# Patient Record
Sex: Female | Born: 1975 | Race: Black or African American | Hispanic: No | Marital: Single | Smoking: Current some day smoker
Health system: Southern US, Community
[De-identification: ages and names within clinical notes are randomized; demographics above are authoritative.]

## PROBLEM LIST (undated history)

## (undated) DIAGNOSIS — N921 Excessive and frequent menstruation with irregular cycle: Secondary | ICD-10-CM

## (undated) DIAGNOSIS — I10 Essential (primary) hypertension: Secondary | ICD-10-CM

## (undated) DIAGNOSIS — D251 Intramural leiomyoma of uterus: Secondary | ICD-10-CM

## (undated) DIAGNOSIS — K219 Gastro-esophageal reflux disease without esophagitis: Secondary | ICD-10-CM

## (undated) DIAGNOSIS — D500 Iron deficiency anemia secondary to blood loss (chronic): Secondary | ICD-10-CM

## (undated) HISTORY — PX: CHOLECYSTECTOMY: SHX55

## (undated) HISTORY — PX: WISDOM TOOTH EXTRACTION: SHX21

---

## 2008-02-20 ENCOUNTER — Other Ambulatory Visit: Payer: Self-pay

## 2008-02-20 ENCOUNTER — Emergency Department: Payer: Self-pay | Admitting: Emergency Medicine

## 2008-03-10 ENCOUNTER — Emergency Department: Payer: Self-pay

## 2010-03-01 ENCOUNTER — Emergency Department: Payer: Self-pay | Admitting: Emergency Medicine

## 2011-06-09 ENCOUNTER — Ambulatory Visit: Payer: Self-pay | Admitting: Internal Medicine

## 2011-06-13 ENCOUNTER — Inpatient Hospital Stay: Payer: Self-pay | Admitting: Surgery

## 2011-06-17 LAB — PATHOLOGY REPORT

## 2011-07-09 ENCOUNTER — Ambulatory Visit: Payer: Self-pay | Admitting: Internal Medicine

## 2015-11-18 ENCOUNTER — Encounter: Payer: Self-pay | Admitting: Emergency Medicine

## 2015-11-18 ENCOUNTER — Emergency Department
Admission: EM | Admit: 2015-11-18 | Discharge: 2015-11-19 | Disposition: A | Discharge status: Home / Self Care | Payer: Self-pay | Attending: Emergency Medicine | Admitting: Emergency Medicine

## 2015-11-18 DIAGNOSIS — S61214A Laceration without foreign body of right ring finger without damage to nail, initial encounter: Secondary | ICD-10-CM | POA: Insufficient documentation

## 2015-11-18 DIAGNOSIS — Y288XXA Contact with other sharp object, undetermined intent, initial encounter: Secondary | ICD-10-CM | POA: Insufficient documentation

## 2015-11-18 DIAGNOSIS — S61212A Laceration without foreign body of right middle finger without damage to nail, initial encounter: Secondary | ICD-10-CM | POA: Insufficient documentation

## 2015-11-18 DIAGNOSIS — F17200 Nicotine dependence, unspecified, uncomplicated: Secondary | ICD-10-CM | POA: Insufficient documentation

## 2015-11-18 DIAGNOSIS — Y92019 Unspecified place in single-family (private) house as the place of occurrence of the external cause: Secondary | ICD-10-CM | POA: Insufficient documentation

## 2015-11-18 DIAGNOSIS — IMO0002 Reserved for concepts with insufficient information to code with codable children: Secondary | ICD-10-CM

## 2015-11-18 DIAGNOSIS — Y998 Other external cause status: Secondary | ICD-10-CM | POA: Insufficient documentation

## 2015-11-18 DIAGNOSIS — I10 Essential (primary) hypertension: Secondary | ICD-10-CM | POA: Insufficient documentation

## 2015-11-18 DIAGNOSIS — Y9389 Activity, other specified: Secondary | ICD-10-CM | POA: Insufficient documentation

## 2015-11-18 HISTORY — DX: Essential (primary) hypertension: I10

## 2015-11-18 NOTE — ED Notes (Signed)
Pt arrived from home by EMS post laceration to the right ring finger and top of middle finger. Pt states she hit a 40oz beer bottle and the bottle broke cutting her fingers. Pt states "I cant feel my fingers."

## 2015-11-19 ENCOUNTER — Emergency Department: Payer: Self-pay

## 2015-11-19 MED ORDER — LIDOCAINE HCL (PF) 1 % IJ SOLN
5.0000 mL | Freq: Once | INTRAMUSCULAR | Status: AC
  Administered 2015-11-19: 5 mL

## 2015-11-19 MED ORDER — LIDOCAINE HCL (PF) 1 % IJ SOLN
INTRAMUSCULAR | Status: AC
  Administered 2015-11-19: 5 mL
  Filled 2015-11-19: qty 5

## 2015-11-19 MED ORDER — LIDOCAINE HCL (PF) 1 % IJ SOLN
INTRAMUSCULAR | Status: AC
  Filled 2015-11-19: qty 10

## 2015-11-19 NOTE — Discharge Instructions (Signed)
Please keep the bandages intact for the next 48 hours. Afterward she can remove, keep cuts covered with bacitracin or Neosporin and bandages. Please return to your primary care doctor in 7 days for suture removal. Please return to the emergency department for any signs of infection such as increased redness, increased pain, swelling, pus or fever. As we discussed he may experience numbness in the finger if the nerve was injured, if you continue to experience numbness in the finger please follow-up with orthopedics by calling the number provided.   Laceration Care, Adult    A laceration is a cut that goes through all of the layers of the skin and into the tissue that is right under the skin. Some lacerations heal on their own. Others need to be closed with stitches (sutures), staples, skin adhesive strips, or skin glue. Proper laceration care minimizes the risk of infection and helps the laceration to heal better.  HOW TO CARE FOR YOUR LACERATION  If sutures or staples were used:  Keep the wound clean and dry.  If you were given a bandage (dressing), you should change it at least one time per day or as told by your health care provider. You should also change it if it becomes wet or dirty.  Keep the wound completely dry for the first 24 hours or as told by your health care provider. After that time, you may shower or bathe. However, make sure that the wound is not soaked in water until after the sutures or staples have been removed.  Clean the wound one time each day or as told by your health care provider:  Wash the wound with soap and water.  Rinse the wound with water to remove all soap.  Pat the wound dry with a clean towel. Do not rub the wound. After cleaning the wound, apply a thin layer of antibiotic ointment as told by your health care provider. This will help to prevent infection and keep the dressing from sticking to the wound.  Have the sutures or staples removed as told by your health  care provider. If skin adhesive strips were used:  Keep the wound clean and dry.  If you were given a bandage (dressing), you should change it at least one time per day or as told by your health care provider. You should also change it if it becomes dirty or wet.  Do not get the skin adhesive strips wet. You may shower or bathe, but be careful to keep the wound dry.  If the wound gets wet, pat it dry with a clean towel. Do not rub the wound.  Skin adhesive strips fall off on their own. You may trim the strips as the wound heals. Do not remove skin adhesive strips that are still stuck to the wound. They will fall off in time. If skin glue was used:  Try to keep the wound dry, but you may briefly wet it in the shower or bath. Do not soak the wound in water, such as by swimming.  After you have showered or bathed, gently pat the wound dry with a clean towel. Do not rub the wound.  Do not do any activities that will make you sweat heavily until the skin glue has fallen off on its own.  Do not apply liquid, cream, or ointment medicine to the wound while the skin glue is in place. Using those may loosen the film before the wound has healed.  If you were given a  bandage (dressing), you should change it at least one time per day or as told by your health care provider. You should also change it if it becomes dirty or wet.  If a dressing is placed over the wound, be careful not to apply tape directly over the skin glue. Doing that may cause the glue to be pulled off before the wound has healed.  Do not pick at the glue. The skin glue usually remains in place for 5-10 days, then it falls off of the skin. General Instructions  Take over-the-counter and prescription medicines only as told by your health care provider.  If you were prescribed an antibiotic medicine or ointment, take or apply it as told by your doctor. Do not stop using it even if your condition improves.  To help prevent scarring, make sure to  cover your wound with sunscreen whenever you are outside after stitches are removed, after adhesive strips are removed, or when glue remains in place and the wound is healed. Make sure to wear a sunscreen of at least 30 SPF.  Do not scratch or pick at the wound.  Keep all follow-up visits as told by your health care provider. This is important.  Check your wound every day for signs of infection. Watch for:  Redness, swelling, or pain.  Fluid, blood, or pus. Raise (elevate) the injured area above the level of your heart while you are sitting or lying down, if possible. SEEK MEDICAL CARE IF:  You received a tetanus shot and you have swelling, severe pain, redness, or bleeding at the injection site.  You have a fever.  A wound that was closed breaks open.  You notice a bad smell coming from your wound or your dressing.  You notice something coming out of the wound, such as wood or glass.  Your pain is not controlled with medicine.  You have increased redness, swelling, or pain at the site of your wound.  You have fluid, blood, or pus coming from your wound.  You notice a change in the color of your skin near your wound.  You need to change the dressing frequently due to fluid, blood, or pus draining from the wound.  You develop a new rash.  You develop numbness around the wound. SEEK IMMEDIATE MEDICAL CARE IF:  You develop severe swelling around the wound.  Your pain suddenly increases and is severe.  You develop painful lumps near the wound or on skin that is anywhere on your body.  You have a red streak going away from your wound.  The wound is on your hand or foot and you cannot properly move a finger or toe.  The wound is on your hand or foot and you notice that your fingers or toes look pale or bluish. This information is not intended to replace advice given to you by your health care provider. Make sure you discuss any questions you have with your health care provider.  Document  Released: 07/25/2005 Document Revised: 12/09/2014 Document Reviewed: 07/21/2014  Elsevier Interactive Patient Education [employer].

## 2015-11-19 NOTE — ED Provider Notes (Signed)
[facility] Emergency Department Provider Note  Time seen: 12:14 AM  I have reviewed the triage vital signs and the nursing notes.   HISTORY  Chief Complaint Laceration    HPI Brandi Nichols is a 40 y.o. female with a past medical history of hypertension presents to the emergency department with a laceration to her right hand. According to the patient she attempted to pick up a broken beer bottle which cut her right third and fourth digits. States blood was spurting out of her middle fingers as he came to the emergency department. Denies any other injuries denies LOC. Admits alcohol use tonight.     Past Medical History  Diagnosis Date  . Hypertension     There are no active problems to display for this patient.   Past Surgical History  Procedure Laterality Date  . Cholecystectomy      No current outpatient prescriptions on file.  Allergies Review of patient's allergies indicates no known allergies.  History reviewed. No pertinent family history.  Social History Social History  Substance Use Topics  . Smoking status: Current Some Day Smoker  . Smokeless tobacco: None  . Alcohol Use: Yes    Review of Systems Constitutional: Negative for fever. Cardiovascular: Negative for chest pain. Respiratory: Negative for shortness of breath. Gastrointestinal: Negative for abdominal pain Musculoskeletal: Negative for back pain. Skin: Laceration to right third and fourth digits Neurological: Negative for headache 10-point ROS otherwise negative.  ____________________________________________   PHYSICAL EXAM:  VITAL SIGNS: ED Triage Vitals  Enc Vitals Group     BP 11/18/15 2350 159/85 mmHg     Pulse Rate 11/18/15 2350 105     Resp --      Temp 11/18/15 2350 98.4 F (36.9 C)     Temp Source 11/18/15 2350 Oral     SpO2 11/18/15 2350 99 %     Weight 11/18/15 2343 193 lb (87.544 kg)     Height 11/18/15 2343 5\' 5"  (1.651 m)     Head Cir --       Peak Flow --      Pain Score 11/18/15 2343 10     Pain Loc --      Pain Edu? --      Excl. in [place]? --     Constitutional: Alert and oriented. Well appearing and in no distress. Eyes: Normal exam ENT   Head: Normocephalic and atraumatic.   Mouth/Throat: Mucous membranes are moist. Cardiovascular: Normal rate, regular rhythm. No murmur Respiratory: Normal respiratory effort without tachypnea nor retractions. Breath sounds are clear Gastrointestinal: Soft and nontender. No distention.   Musculoskeletal: Approximate 1.5 cm laceration to her fourth right digit finger pad, hemostatic. Proximate 1.5 cm laceration to the medial aspect of her right third digit with arterial bleed. Neurologic:  Normal speech and language. Patient states subjective numbness to the distal aspect of the right third digit. Skin:  Skin is warm, dry and intact.  Psychiatric: Mood and affect are normal. Speech and behavior are normal.  ____________________________________________     RADIOLOGY  No radiopaque foreign body.  ____________________________________________    INITIAL IMPRESSION / ASSESSMENT AND PLAN / ED COURSE  Pertinent labs & imaging results that were available during my care of the patient were reviewed by me and considered in my medical decision making (see chart for details).  Patient presents with laceration to right third and fourth digits. Currently with a small arterial bleed to the right third digit. We will apply  pressure, I will perform digital blocks and we'll obtain an x-ray. Patient's last tetanus shot was within the past 3 years per patient. Patient will require laceration repair to both digits.  X-ray negative for foreign body. Digital block performed. Patient does not wish to have her fourth digit repaired, laceration is small. We will repair the patient's third digit.  LACERATION REPAIR Performed by: Harvest Dark Authorized by: Harvest Dark Consent:  Verbal consent obtained. Risks and benefits: risks, benefits and alternatives were discussed Consent given by: patient Patient identity confirmed: provided demographic data Prepped and Draped in normal sterile fashion Wound explored  Laceration Location: Right third finger  Laceration Length: 1.5 cm  No Foreign Bodies seen or palpated  Anesthesia: Digital block   Local anesthetic: lidocaine 1 % without epinephrine  Anesthetic total: 4 ml  Irrigation method: syringe Amount of cleaning: standard  Skin closure: Suture, 5-0 proline   Number of sutures: 3   Technique: Simple interrupted   Patient tolerance: Patient tolerated the procedure well with no immediate complications.   ____________________________________________   FINAL CLINICAL IMPRESSION(S) / ED DIAGNOSES  Right hand laceration   Harvest Dark, MD 11/19/15 [phone]

## 2015-11-19 NOTE — ED Notes (Signed)
Pt discharged to home.  Discharge instructions reviewed.  Verbalized understanding.  No questions or concerns at this time.  Teach back verified.  Pt in NAD.  No items left in ED.   

## 2016-01-02 ENCOUNTER — Encounter: Payer: Self-pay | Admitting: Emergency Medicine

## 2016-01-02 ENCOUNTER — Emergency Department: Payer: Self-pay

## 2016-01-02 ENCOUNTER — Emergency Department
Admission: EM | Admit: 2016-01-02 | Discharge: 2016-01-02 | Disposition: A | Discharge status: Home / Self Care | Payer: Self-pay | Attending: Emergency Medicine | Admitting: Emergency Medicine

## 2016-01-02 DIAGNOSIS — M542 Cervicalgia: Secondary | ICD-10-CM | POA: Insufficient documentation

## 2016-01-02 DIAGNOSIS — M791 Myalgia: Secondary | ICD-10-CM | POA: Insufficient documentation

## 2016-01-02 DIAGNOSIS — M7918 Myalgia, other site: Secondary | ICD-10-CM

## 2016-01-02 DIAGNOSIS — I10 Essential (primary) hypertension: Secondary | ICD-10-CM | POA: Insufficient documentation

## 2016-01-02 DIAGNOSIS — F17210 Nicotine dependence, cigarettes, uncomplicated: Secondary | ICD-10-CM | POA: Insufficient documentation

## 2016-01-02 MED ORDER — KETOROLAC TROMETHAMINE 60 MG/2ML IM SOLN
60.0000 mg | Freq: Once | INTRAMUSCULAR | Status: AC
  Administered 2016-01-02: 60 mg via INTRAMUSCULAR
  Filled 2016-01-02: qty 2

## 2016-01-02 MED ORDER — LIDOCAINE 5 % EX PTCH: 1.0000 | MEDICATED_PATCH | Freq: Two times a day (BID) | CUTANEOUS | Status: AC

## 2016-01-02 MED ORDER — LIDOCAINE 5 % EX PTCH
1.0000 | MEDICATED_PATCH | CUTANEOUS | Status: DC
  Administered 2016-01-02: 1 via TRANSDERMAL
  Filled 2016-01-02 (×2): qty 1

## 2016-01-02 MED ORDER — DIAZEPAM 2 MG PO TABS
2.0000 mg | ORAL_TABLET | Freq: Once | ORAL | Status: AC
  Administered 2016-01-02: 2 mg via ORAL
  Filled 2016-01-02: qty 1

## 2016-01-02 MED ORDER — ETODOLAC 200 MG PO CAPS: 200.0000 mg | ORAL_CAPSULE | Freq: Three times a day (TID) | ORAL | Status: DC

## 2016-01-02 NOTE — ED Notes (Signed)
Pt alert and oriented X4, active, cooperative, pt in NAD. RR even and unlabored, color WNL.  Pt informed to return if any life threatening symptoms occur.   

## 2016-01-02 NOTE — Discharge Instructions (Signed)
Musculoskeletal Pain Musculoskeletal pain is muscle and boney aches and pains. These pains can occur in any part of the body. Your caregiver may treat you without knowing the cause of the pain. They may treat you if blood or urine tests, X-rays, and other tests were normal.  CAUSES There is often not a definite cause or reason for these pains. These pains may be caused by a type of germ (virus). The discomfort may also come from overuse. Overuse includes working out too hard when your body is not fit. Boney aches also come from weather changes. Bone is sensitive to atmospheric pressure changes. HOME CARE INSTRUCTIONS   Ask when your test results will be ready. Make sure you get your test results.  Only take over-the-counter or prescription medicines for pain, discomfort, or fever as directed by your caregiver. If you were given medications for your condition, do not drive, operate machinery or power tools, or sign legal documents for 24 hours. Do not drink alcohol. Do not take sleeping pills or other medications that may interfere with treatment.  Continue all activities unless the activities cause more pain. When the pain lessens, slowly resume normal activities. Gradually increase the intensity and duration of the activities or exercise.  During periods of severe pain, bed rest may be helpful. Lay or sit in any position that is comfortable.  Putting ice on the injured area.  Put ice in a bag.  Place a towel between your skin and the bag.  Leave the ice on for 15 to 20 minutes, 3 to 4 times a day.  Follow up with your caregiver for continued problems and no reason can be found for the pain. If the pain becomes worse or does not go away, it may be necessary to repeat tests or do additional testing. Your caregiver may need to look further for a possible cause. SEEK IMMEDIATE MEDICAL CARE IF:  You have pain that is getting worse and is not relieved by medications.  You develop chest pain  that is associated with shortness or breath, sweating, feeling sick to your stomach (nauseous), or throw up (vomit).  Your pain becomes localized to the abdomen.  You develop any new symptoms that seem different or that concern you. MAKE SURE YOU:   Understand these instructions.  Will watch your condition.  Will get help right away if you are not doing well or get worse.   This information is not intended to replace advice given to you by your health care provider. Make sure you discuss any questions you have with your health care provider.   Document Released: 07/25/2005 Document Revised: 10/17/2011 Document Reviewed: 03/29/2013 Elsevier Interactive Patient Education 2016 Elsevier [employer]  Cervical Strain and Sprain  Cervical strain and sprain are injuries that commonly occur with "whiplash" injuries. Whiplash occurs when the neck is forcefully whipped backward or forward, such as during a motor vehicle accident or during contact sports. The muscles, ligaments, tendons, discs, and nerves of the neck are susceptible to injury when this occurs. RISK FACTORS Risk of having a whiplash injury increases if:  Osteoarthritis of the spine.  Situations that make head or neck accidents or trauma more likely.  High-risk sports (football, rugby, wrestling, hockey, auto racing, gymnastics, diving, contact karate, or boxing).  Poor strength and flexibility of the neck.  Previous neck injury.  Poor tackling technique.  Improperly fitted or padded equipment. SYMPTOMS   Pain or stiffness in the front or back of neck or both.  Symptoms  may present immediately or up to 24 hours after injury.  Dizziness, headache, nausea, and vomiting.  Muscle spasm with soreness and stiffness in the neck.  Tenderness and swelling at the injury site. PREVENTION  Learn and use proper technique (avoid tackling with the head, spearing, and head-butting; use proper falling techniques to avoid landing on the  head).  Warm up and stretch properly before activity.  Maintain physical fitness:  Strength, flexibility, and endurance.  Cardiovascular fitness.  Wear properly fitted and padded protective equipment, such as padded soft collars, for participation in contact sports. PROGNOSIS  Recovery from cervical strain and sprain injuries is dependent on the extent of the injury. These injuries are usually curable in 1 week to 3 months with appropriate treatment.  RELATED COMPLICATIONS   Temporary numbness and weakness may occur if the nerve roots are damaged, and this may persist until the nerve has completely healed.  Chronic pain due to frequent recurrence of symptoms.  Prolonged healing, especially if activity is resumed too soon (before complete recovery). TREATMENT  Treatment initially involves the use of ice and medication to help reduce pain and inflammation. It is also important to perform strengthening and stretching exercises and modify activities that worsen symptoms so the injury does not get worse. These exercises may be performed at home or with a therapist. For patients who experience severe symptoms, a soft, padded collar may be recommended to be worn around the neck.  Improving your posture may help reduce symptoms. Posture improvement includes pulling your chin and abdomen in while sitting or standing. If you are sitting, sit in a firm chair with your buttocks against the back of the chair. While sleeping, try replacing your pillow with a small towel rolled to 2 inches in diameter, or use a cervical pillow or soft cervical collar. Poor sleeping positions delay healing.  For patients with nerve root damage, which causes numbness or weakness, the use of a cervical traction apparatus may be recommended. Surgery is rarely necessary for these injuries. However, cervical strain and sprains that are present at birth (congenital) may require surgery. MEDICATION   If pain medication is  necessary, nonsteroidal anti-inflammatory medications, such as aspirin and ibuprofen, or other minor pain relievers, such as acetaminophen, are often recommended.  Do not take pain medication for 7 days before surgery.  Prescription pain relievers may be given if deemed necessary by your caregiver. Use only as directed and only as much as you need. HEAT AND COLD:   Cold treatment (icing) relieves pain and reduces inflammation. Cold treatment should be applied for 10 to 15 minutes every 2 to 3 hours for inflammation and pain and immediately after any activity that aggravates your symptoms. Use ice packs or an ice massage.  Heat treatment may be used prior to performing the stretching and strengthening activities prescribed by your caregiver, physical therapist, or athletic trainer. Use a heat pack or a warm soak. SEEK MEDICAL CARE IF:   Symptoms get worse or do not improve in 2 weeks despite treatment.  New, unexplained symptoms develop (drugs used in treatment may produce side effects).

## 2016-01-02 NOTE — ED Provider Notes (Signed)
[facility] Emergency Department Provider Note   ____________________________________________  Time seen: Approximately [id number] AM  I have reviewed the triage vital signs and the nursing notes.   HISTORY  Chief Complaint Numbness and Neck Pain    HPI Brandi Nichols is a 40 y.o. female who comes into the ED with 1 week of neck pain. The patient reports that the pain hurts very bad. She denies any trauma but reports she does a lot of lifting at work. She's not sure if this is due to work stress or if she slept wrong. The patient reports that she has taken Tylenol for the pain but has not helped. She is also been some icy hot patches. The patient feels some numbness to her right shoulder but does not go all the way down to her fingers or hands. She reports that she's never had this happen before. The patient rates her pain a 7 out of 10 in intensity. She was concerned that this was going on for the past week so she decided to come into the hospital for evaluation.   Past Medical History  Diagnosis Date  . Hypertension     There are no active problems to display for this patient.   Past Surgical History  Procedure Laterality Date  . Cholecystectomy      Current Outpatient Rx  Name  Route  Sig  Dispense  Refill  . etodolac (LODINE) 200 MG capsule   Oral   Take 1 capsule (200 mg total) by mouth every 8 (eight) hours.   12 capsule   0   . lidocaine (LIDODERM) 5 %   Transdermal   Place 1 patch onto the skin every 12 (twelve) hours. Remove & Discard patch within 12 hours or as directed by MD   10 patch   0     Allergies Review of patient's allergies indicates no known allergies.  No family history on file.  Social History Social History  Substance Use Topics  . Smoking status: Current Some Day Smoker    Types: Cigarettes  . Smokeless tobacco: Never Used  . Alcohol Use: Yes    Review of Systems Constitutional: No fever/chills Eyes: No  visual changes. ENT: No sore throat. Cardiovascular: Denies chest pain. Respiratory: Denies shortness of breath. Gastrointestinal: No abdominal pain.  No nausea, no vomiting.  No diarrhea.  No constipation. Genitourinary: Negative for dysuria. Musculoskeletal: Neck pain, shoulder pain Skin: Negative for rash. Neurological: Negative for headaches, focal weakness or numbness.  10-point ROS otherwise negative.  ____________________________________________   PHYSICAL EXAM:  VITAL SIGNS: ED Triage Vitals  Enc Vitals Group     BP 01/02/16 0110 149/76 mmHg     Pulse Rate 01/02/16 0110 84     Resp 01/02/16 0110 18     Temp 01/02/16 0110 98.2 F (36.8 C)     Temp Source 01/02/16 0110 Oral     SpO2 01/02/16 0110 100 %     Weight 01/02/16 0110 196 lb (88.905 kg)     Height 01/02/16 0110 5\' 5"  (1.651 m)     Head Cir --      Peak Flow --      Pain Score 01/02/16 0110 8     Pain Loc --      Pain Edu? --      Excl. in [place]? --     Constitutional: Alert and oriented. Well appearing and in Mild distress. Eyes: Conjunctivae are normal. PERRL. EOMI. Head: Atraumatic.  Nose: No congestion/rhinnorhea. Mouth/Throat: Mucous membranes are moist.  Oropharynx non-erythematous. Neck: Mild cervical spine tenderness to palpation. Tenderness to palpation of right trapezius into the deltoid area I'll do pain with range of motion. Cardiovascular: Normal rate, regular rhythm. Grossly normal heart sounds.  Good peripheral circulation. Respiratory: Normal respiratory effort.  No retractions. Lungs CTAB. Gastrointestinal: Soft and nontender. No distention. Positive bowel sounds Musculoskeletal: No lower extremity tenderness nor edema.  Neurologic:  Normal speech and language.  Skin:  Skin is warm, dry and intact.  Psychiatric: Mood and affect are normal.   ____________________________________________   LABS (all labs ordered are listed, but only abnormal results are displayed)  Labs Reviewed - No  data to display ____________________________________________  EKG  None ____________________________________________  RADIOLOGY  Cervical spine x-ray: Degenerative disc disease in the cervical spine, no acute bony abnormality is seen him radiographically. ____________________________________________   PROCEDURES  Procedure(s) performed: None  Critical Care performed: No  ____________________________________________   INITIAL IMPRESSION / ASSESSMENT AND PLAN / ED COURSE  Pertinent labs & imaging results that were available during my care of the patient were reviewed by me and considered in my medical decision making (see chart for details).  This is a 40 year old female who comes in with some right neck pain as well as some numbness to her shoulder and upper arm. I did perform an x-ray with a concern for degenerative disc disease which was discovered on the x-ray. I gave the patient a dose of Valium, Toradol and Lidoderm patch. The patient is feeling improved. She was initially sleeping in the room when I arrived. The patient will be discharged home to follow-up with her primary care physician. ____________________________________________   FINAL CLINICAL IMPRESSION(S) / ED DIAGNOSES  Final diagnoses:  Cervicalgia  Musculoskeletal pain      NEW MEDICATIONS STARTED DURING THIS VISIT:  Discharge Medication List as of 01/02/2016  6:45 AM    START taking these medications   Details  etodolac (LODINE) 200 MG capsule Take 1 capsule (200 mg total) by mouth every 8 (eight) hours., Starting 01/02/2016, Until Discontinued, Print    lidocaine (LIDODERM) 5 % Place 1 patch onto the skin every 12 (twelve) hours. Remove & Discard patch within 12 hours or as directed by MD, Starting 01/02/2016, Until Sun 01/01/17, Print         Note:  This document was prepared using Dragon voice recognition software and may include unintentional dictation errors.    Loney Hering,  MD 01/02/16 [phone]

## 2016-01-02 NOTE — ED Notes (Signed)
Pt presents to ED with neck pain; unsure of any injury. Pt states she woke up a week ago with neck pain that limits her range of motion. Today her pain made her right arm feel numb. Pt states her pain is affecting her sleeping and her work.

## 2016-01-18 ENCOUNTER — Emergency Department
Admission: EM | Admit: 2016-01-18 | Discharge: 2016-01-18 | Disposition: A | Discharge status: Home / Self Care | Payer: Self-pay | Attending: Emergency Medicine | Admitting: Emergency Medicine

## 2016-01-18 ENCOUNTER — Encounter: Payer: Self-pay | Admitting: Emergency Medicine

## 2016-01-18 ENCOUNTER — Emergency Department: Payer: Self-pay

## 2016-01-18 DIAGNOSIS — Z79899 Other long term (current) drug therapy: Secondary | ICD-10-CM | POA: Insufficient documentation

## 2016-01-18 DIAGNOSIS — I10 Essential (primary) hypertension: Secondary | ICD-10-CM | POA: Insufficient documentation

## 2016-01-18 DIAGNOSIS — M25572 Pain in left ankle and joints of left foot: Secondary | ICD-10-CM | POA: Insufficient documentation

## 2016-01-18 DIAGNOSIS — F17210 Nicotine dependence, cigarettes, uncomplicated: Secondary | ICD-10-CM | POA: Insufficient documentation

## 2016-01-18 MED ORDER — TRAMADOL HCL 50 MG PO TABS: 50.0000 mg | ORAL_TABLET | Freq: Four times a day (QID) | ORAL | Status: AC | PRN

## 2016-01-18 MED ORDER — NAPROXEN 500 MG PO TABS: 500.0000 mg | ORAL_TABLET | Freq: Two times a day (BID) | ORAL | Status: AC

## 2016-01-18 NOTE — ED Provider Notes (Signed)
[facility] Emergency Department Provider Note   ____________________________________________  Time seen: Approximately 5:38 PM  I have reviewed the triage vital signs and the nursing notes.   HISTORY  Chief Complaint Ankle Pain    HPI Brandi Nichols is a 40 y.o. female patient complaining of pain that radiates from the top of foot to the medial aspect of her ankle 3 days. Patient state approximate month ago she twisted her foot playing horseshoes. Patient states she had pain to the ankle and foot was seems to get gotten better until 3 days ago. Patient states pain increases with ambulation and prolonged standing. No palliative measures taken for this complaint. Patient rates the pain as a 7/10.   Past Medical History  Diagnosis Date  . Hypertension     There are no active problems to display for this patient.   Past Surgical History  Procedure Laterality Date  . Cholecystectomy      Current Outpatient Rx  Name  Route  Sig  Dispense  Refill  . etodolac (LODINE) 200 MG capsule   Oral   Take 1 capsule (200 mg total) by mouth every 8 (eight) hours.   12 capsule   0   . lidocaine (LIDODERM) 5 %   Transdermal   Place 1 patch onto the skin every 12 (twelve) hours. Remove & Discard patch within 12 hours or as directed by MD   10 patch   0   . naproxen (NAPROSYN) 500 MG tablet   Oral   Take 1 tablet (500 mg total) by mouth 2 (two) times daily with a meal.   30 tablet   0   . traMADol (ULTRAM) 50 MG tablet   Oral   Take 1 tablet (50 mg total) by mouth every 6 (six) hours as needed.   20 tablet   0     Allergies Review of patient's allergies indicates no known allergies.  No family history on file.  Social History Social History  Substance Use Topics  . Smoking status: Current Some Day Smoker    Types: Cigarettes  . Smokeless tobacco: Never Used  . Alcohol Use: 1.8 oz/week    3 Standard drinks or equivalent per week     Review of Systems Constitutional: No fever/chills Eyes: No visual changes. ENT: No sore throat. Cardiovascular: Denies chest pain. Respiratory: Denies shortness of breath. Gastrointestinal: No abdominal pain.  No nausea, no vomiting.  No diarrhea.  No constipation. Genitourinary: Negative for dysuria. Musculoskeletal: Left foot and ankle pain Skin: Negative for rash. Neurological: Negative for headaches, focal weakness or numbness.    ____________________________________________   PHYSICAL EXAM:  VITAL SIGNS: ED Triage Vitals  Enc Vitals Group     BP --      Pulse --      Resp --      Temp --      Temp src --      SpO2 --      Weight --      Height --      Head Cir --      Peak Flow --      Pain Score --      Pain Loc --      Pain Edu? --      Excl. in [place]? --     Constitutional: Alert and oriented. Well appearing and in no acute distress. Eyes: Conjunctivae are normal. PERRL. EOMI. Head: Atraumatic. Nose: No congestion/rhinnorhea. Mouth/Throat: Mucous membranes are  moist.  Oropharynx non-erythematous. Neck: No stridor.  No cervical spine tenderness to palpation. Hematological/Lymphatic/Immunilogical: No cervical lymphadenopathy. Cardiovascular: Normal rate, regular rhythm. Grossly normal heart sounds.  Good peripheral circulation. Respiratory: Normal respiratory effort.  No retractions. Lungs CTAB. Gastrointestinal: Soft and nontender. No distention. No abdominal bruits. No CVA tenderness. Musculoskeletal: No obvious deformity to the left foot and ankle. Patient has some moderate guarding palpation medial malleolus and dorsal aspect of the foot. Neurologic:  Normal speech and language. No gross focal neurologic deficits are appreciated. No gait instability. Skin:  Skin is warm, dry and intact. No rash noted. Psychiatric: Mood and affect are normal. Speech and behavior are normal.  ____________________________________________   LABS (all labs ordered are  listed, but only abnormal results are displayed)  Labs Reviewed - No data to display ____________________________________________  EKG   ____________________________________________  RADIOLOGY  No acute findings on x-ray. Patient has findings suggestive of prior injury to the distal tibia. ____________________________________________   PROCEDURES  Procedure(s) performed: None  Critical Care performed: No  ____________________________________________   INITIAL IMPRESSION / ASSESSMENT AND PLAN / ED COURSE  Pertinent labs & imaging results that were available during my care of the patient were reviewed by me and considered in my medical decision making (see chart for details).  Left foot and ankle pain secondary to previous injury. Discussed x-ray finding with patient. Patient given discharge care instruction. Patient given a prescription for tramadol and naproxen. Patient advised we are ankle support an open shoe for 2 weeks. Patient advised to follow-up with podiatry if no improvement in 2 weeks. ____________________________________________   FINAL CLINICAL IMPRESSION(S) / ED DIAGNOSES  Final diagnoses:  Foot joint pain, left      NEW MEDICATIONS STARTED DURING THIS VISIT:  New Prescriptions   NAPROXEN (NAPROSYN) 500 MG TABLET    Take 1 tablet (500 mg total) by mouth 2 (two) times daily with a meal.   TRAMADOL (ULTRAM) 50 MG TABLET    Take 1 tablet (50 mg total) by mouth every 6 (six) hours as needed.     Note:  This document was prepared using Dragon voice recognition software and may include unintentional dictation errors.    Sable Feil, PA-C 01/18/16 1835  Delman Kitten, MD 01/19/16 0010

## 2016-01-18 NOTE — ED Notes (Signed)
Pt reports that she was playing horseshoes a month or so ago and hurt her ankle. States it has started hurting again, with pain shooting up her leg in the last few days. NAD noted.

## 2016-01-18 NOTE — Discharge Instructions (Signed)
Were open shoe for 2 weeks. Joint Pain Joint pain, which is also called arthralgia, can be caused by many things. Joint pain often goes away when you follow your health care provider's instructions for relieving pain at home. However, joint pain can also be caused by conditions that require further treatment. Common causes of joint pain include:  Bruising in the area of the joint.  Overuse of the joint.  Wear and tear on the joints that occur with aging (osteoarthritis).  Various other forms of arthritis.  A buildup of a crystal form of uric acid in the joint (gout).  Infections of the joint (septic arthritis) or of the bone (osteomyelitis). Your health care provider may recommend medicine to help with the pain. If your joint pain continues, additional tests may be needed to diagnose your condition. HOME CARE INSTRUCTIONS Watch your condition for any changes. Follow these instructions as directed to lessen the pain that you are feeling.  Take medicines only as directed by your health care provider.  Rest the affected area for as long as your health care provider says that you should. If directed to do so, raise the painful joint above the level of your heart while you are sitting or lying down.  Do not do things that cause or worsen pain.  If directed, apply ice to the painful area:  Put ice in a plastic bag.  Place a towel between your skin and the bag.  Leave the ice on for 20 minutes, 2-3 times per day.  Wear an elastic bandage, splint, or sling as directed by your health care provider. Loosen the elastic bandage or splint if your fingers or toes become numb and tingle, or if they turn cold and blue.  Begin exercising or stretching the affected area as directed by your health care provider. Ask your health care provider what types of exercise are safe for you.  Keep all follow-up visits as directed by your health care provider. This is important. SEEK MEDICAL CARE IF:  Your  pain increases, and medicine does not help.  Your joint pain does not improve within 3 days.  You have increased bruising or swelling.  You have a fever.  You lose 10 lb (4.5 kg) or more without trying. SEEK IMMEDIATE MEDICAL CARE IF:  You are not able to move the joint.  Your fingers or toes become numb or they turn cold and blue.   This information is not intended to replace advice given to you by your health care provider. Make sure you discuss any questions you have with your health care provider.   Document Released: 07/25/2005 Document Revised: 08/15/2014 Document Reviewed: 05/06/2014 Elsevier Interactive Patient Education [employer].

## 2018-02-03 ENCOUNTER — Other Ambulatory Visit: Payer: Self-pay

## 2018-02-03 ENCOUNTER — Emergency Department
Admission: EM | Admit: 2018-02-03 | Discharge: 2018-02-03 | Disposition: A | Discharge status: Home / Self Care | Payer: Self-pay | Attending: Emergency Medicine | Admitting: Emergency Medicine

## 2018-02-03 DIAGNOSIS — H53149 Visual discomfort, unspecified: Secondary | ICD-10-CM | POA: Insufficient documentation

## 2018-02-03 DIAGNOSIS — M25512 Pain in left shoulder: Secondary | ICD-10-CM | POA: Insufficient documentation

## 2018-02-03 DIAGNOSIS — F17210 Nicotine dependence, cigarettes, uncomplicated: Secondary | ICD-10-CM | POA: Insufficient documentation

## 2018-02-03 DIAGNOSIS — I10 Essential (primary) hypertension: Secondary | ICD-10-CM | POA: Insufficient documentation

## 2018-02-03 DIAGNOSIS — H538 Other visual disturbances: Secondary | ICD-10-CM | POA: Insufficient documentation

## 2018-02-03 DIAGNOSIS — H15102 Unspecified episcleritis, left eye: Secondary | ICD-10-CM | POA: Insufficient documentation

## 2018-02-03 MED ORDER — ERYTHROMYCIN 5 MG/GM OP OINT
TOPICAL_OINTMENT | Freq: Once | OPHTHALMIC | Status: AC
  Administered 2018-02-03: 1 via OPHTHALMIC
  Filled 2018-02-03: qty 1

## 2018-02-03 MED ORDER — FLUORESCEIN SODIUM 1 MG OP STRP
ORAL_STRIP | OPHTHALMIC | Status: AC
  Administered 2018-02-03: 1 via OPHTHALMIC
  Filled 2018-02-03: qty 1

## 2018-02-03 MED ORDER — TRAMADOL HCL 50 MG PO TABS: 50.0000 mg | ORAL_TABLET | Freq: Four times a day (QID) | ORAL | 0 refills | Status: DC | PRN

## 2018-02-03 MED ORDER — TETRACAINE HCL 0.5 % OP SOLN
OPHTHALMIC | Status: AC
  Administered 2018-02-03: 1 [drp] via OPHTHALMIC
  Filled 2018-02-03: qty 4

## 2018-02-03 MED ORDER — FLUORESCEIN SODIUM 1 MG OP STRP
1.0000 | ORAL_STRIP | Freq: Once | OPHTHALMIC | Status: AC
  Administered 2018-02-03: 1 via OPHTHALMIC

## 2018-02-03 MED ORDER — ERYTHROMYCIN 5 MG/GM OP OINT: 1.0000 "application " | TOPICAL_OINTMENT | Freq: Three times a day (TID) | OPHTHALMIC | 0 refills | Status: AC

## 2018-02-03 MED ORDER — KETOROLAC TROMETHAMINE 60 MG/2ML IM SOLN
60.0000 mg | Freq: Once | INTRAMUSCULAR | Status: AC
  Administered 2018-02-03: 60 mg via INTRAMUSCULAR
  Filled 2018-02-03: qty 2

## 2018-02-03 MED ORDER — TETRACAINE HCL 0.5 % OP SOLN
1.0000 [drp] | Freq: Once | OPHTHALMIC | Status: AC
  Administered 2018-02-03: 1 [drp] via OPHTHALMIC

## 2018-02-03 NOTE — ED Provider Notes (Signed)
[facility] Emergency Department Provider Note   ____________________________________________   First MD Initiated Contact with Patient 02/03/18 [phone]     (approximate)  I have reviewed the triage vital signs and the nursing notes.   HISTORY  Chief Complaint Eye Pain    HPI Brandi Nichols is a 42 y.o. female who comes into the hospital today with some redness to her left eye as well as some pain to her left shoulder.  The patient states that she woke up with redness in the corner of her eye.  Her eye was hurting and her shoulder was bothering her.  She feels like something is poking her in the eye.  She denies any discharge but does have some blurred vision.  She reports that her more when the bright light hits it.  She is never had this before.  The patient reports that it hurts to move her eye as well.  She denies any chest pain.  The pain in her arm it goes from her shoulder to her elbow.  She denies any trauma or heavy lifting.  She rates her pain a 7 out of 10 in intensity.  The patient did not take any medicine for the pain.  She is here today for evaluation.   Past Medical History:  Diagnosis Date  . Hypertension     There are no active problems to display for this patient.   Past Surgical History:  Procedure Laterality Date  . CHOLECYSTECTOMY      Prior to Admission medications   Medication Sig Start Date End Date Taking? Authorizing Provider  erythromycin ophthalmic ointment Place 1 application into the left eye 3 (three) times daily for 5 days. 02/03/18 02/08/18  Loney Hering, MD  etodolac (LODINE) 200 MG capsule Take 1 capsule (200 mg total) by mouth every 8 (eight) hours. 01/02/16   Loney Hering, MD  traMADol (ULTRAM) 50 MG tablet Take 1 tablet (50 mg total) by mouth every 6 (six) hours as needed. 02/03/18   Loney Hering, MD    Allergies Patient has no known allergies.  No family history on file.  Social History Social  History   Tobacco Use  . Smoking status: Current Some Day Smoker    Types: Cigarettes  . Smokeless tobacco: Never Used  Substance Use Topics  . Alcohol use: Yes    Alcohol/week: 1.8 oz    Types: 3 Standard drinks or equivalent per week  . Drug use: No    Review of Systems  Constitutional: No fever/chills Eyes: No visual changes. ENT: No sore throat. Cardiovascular: Denies chest pain. Respiratory: Denies shortness of breath. Gastrointestinal: No abdominal pain.  No nausea, no vomiting.  No diarrhea.  No constipation. Genitourinary: Negative for dysuria. Musculoskeletal: Negative for back pain. Skin: Negative for rash. Neurological: Negative for headaches, focal weakness or numbness. [place] is shoulder is so  ____________________________________________   PHYSICAL EXAM:  VITAL SIGNS: ED Triage Vitals  Enc Vitals Group     BP 02/03/18 0520 (!) 157/91     Pulse Rate 02/03/18 0520 82     Resp 02/03/18 0520 16     Temp 02/03/18 0520 98.8 F (37.1 C)     Temp Source 02/03/18 0520 Oral     SpO2 02/03/18 0520 96 %     Weight 02/03/18 0513 182 lb (82.6 kg)     Height 02/03/18 0513 5\' 6"  (1.676 m)     Head Circumference --  Peak Flow --      Pain Score 02/03/18 0513 7     Pain Loc --      Pain Edu? --      Excl. in [place]? --     Constitutional: Alert and oriented. Well appearing and in mild distress. Eyes: Conjunctivae are normal. PERRL. EOMI. Small area of erythema to left lateral eye, no uptake on fluorescein exam, left intraocular pressures 18 and 14 respectively Head: Atraumatic. Nose: No congestion/rhinnorhea. Mouth/Throat: Mucous membranes are moist.  Oropharynx non-erythematous. Cardiovascular: Normal rate, regular rhythm. Grossly normal heart sounds.  Good peripheral circulation. Respiratory: Normal respiratory effort.  No retractions. Lungs CTAB. Gastrointestinal: Soft and nontender. No distention. Positive bowel sounds Musculoskeletal: Tenderness to  palpation of the deltoid and posterior shoulder, pain with passive range of motion above the level of the shoulder. Neurologic:  Normal speech and language.  Skin:  Skin is warm, dry and intact. Psychiatric: Mood and affect are normal.  ____________________________________________   LABS (all labs ordered are listed, but only abnormal results are displayed)  Labs Reviewed - No data to display ____________________________________________  EKG  none ____________________________________________  RADIOLOGY  ED MD interpretation:  none  Official radiology report(s): No results found.  ____________________________________________   PROCEDURES  Procedure(s) performed: None  Procedures  Critical Care performed: No  ____________________________________________   INITIAL IMPRESSION / ASSESSMENT AND PLAN / ED COURSE  As part of my medical decision making, I reviewed the following data within the electronic MEDICAL RECORD NUMBER Notes from prior ED visits and [place] Controlled Substance Database   This is a 42 year old female who comes into the hospital today with some eye pain and left shoulder pain.  I did examine the patient's eye and her visual acuity was evaluated.  It appears that the patient has some episcleritis.  She does not have any pain with range of motion and she has no uptake on fluorescein exam.  I will give the patient some erythromycin ointment and she will be discharged home.  The patient's shoulder pain is reminiscent of rotator cuff injury.  I will give the patient a shot of Toradol and have her follow-up with orthopedic surgery.  The patient be discharged home to follow-up.      ____________________________________________   FINAL CLINICAL IMPRESSION(S) / ED DIAGNOSES  Final diagnoses:  Episcleritis of left eye  Acute pain of left shoulder     ED Discharge Orders        Ordered    erythromycin ophthalmic ointment  3 times daily     02/03/18 0706     traMADol (ULTRAM) 50 MG tablet  Every 6 hours PRN     02/03/18 0706       Note:  This document was prepared using Dragon voice recognition software and may include unintentional dictation errors.    Loney Hering, MD 02/03/18 [id number]

## 2018-02-03 NOTE — Discharge Instructions (Addendum)
Please follow up with ophthalmology and orthopedic surgery for further evaluation of your symptoms.

## 2018-02-03 NOTE — ED Triage Notes (Signed)
Pt in with co left eye pain that radiates to left face and shoulder.

## 2018-02-03 NOTE — ED Notes (Signed)
Registration at bedside.

## 2018-02-03 NOTE — ED Notes (Signed)
ED Provider at bedside. 

## 2019-07-09 ENCOUNTER — Other Ambulatory Visit: Payer: Self-pay

## 2019-07-09 ENCOUNTER — Emergency Department
Admission: EM | Admit: 2019-07-09 | Discharge: 2019-07-09 | Disposition: A | Discharge status: Home / Self Care | Payer: Self-pay | Attending: Emergency Medicine | Admitting: Emergency Medicine

## 2019-07-09 ENCOUNTER — Emergency Department: Payer: Self-pay

## 2019-07-09 ENCOUNTER — Encounter: Payer: Self-pay | Admitting: Emergency Medicine

## 2019-07-09 DIAGNOSIS — M25511 Pain in right shoulder: Secondary | ICD-10-CM | POA: Insufficient documentation

## 2019-07-09 DIAGNOSIS — I10 Essential (primary) hypertension: Secondary | ICD-10-CM | POA: Insufficient documentation

## 2019-07-09 DIAGNOSIS — M545 Low back pain, unspecified: Secondary | ICD-10-CM

## 2019-07-09 DIAGNOSIS — G8929 Other chronic pain: Secondary | ICD-10-CM | POA: Insufficient documentation

## 2019-07-09 DIAGNOSIS — F17210 Nicotine dependence, cigarettes, uncomplicated: Secondary | ICD-10-CM | POA: Insufficient documentation

## 2019-07-09 MED ORDER — TRAMADOL HCL 50 MG PO TABS: 50.0000 mg | ORAL_TABLET | Freq: Four times a day (QID) | ORAL | 0 refills | Status: DC | PRN

## 2019-07-09 MED ORDER — PREDNISONE 10 MG PO TABS: ORAL_TABLET | ORAL | 0 refills | Status: DC
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
## 2019-07-09 NOTE — ED Triage Notes (Signed)
C/O burning pain to right back and right shoulder pain x 2-3 days.  States standing makes pain worse.  Denies urinary symptoms or injury.

## 2019-07-09 NOTE — Discharge Instructions (Signed)
Follow-up with your primary care provider if any continued problems.  Call and make an appointment.  Begin taking prednisone as directed beginning with 6 tablets today and decreasing by 1 tablet daily for the next 6 days.  Tramadol was also sent to the pharmacy as needed for pain.  Do not drive or operate machinery while taking this medication.  You may use ice or heat to your back and shoulder as needed for discomfort.
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
## 2019-09-04 ENCOUNTER — Emergency Department
Admission: EM | Admit: 2019-09-04 | Discharge: 2019-09-04 | Disposition: A | Discharge status: Home / Self Care | Payer: Self-pay | Attending: Emergency Medicine | Admitting: Emergency Medicine

## 2019-09-04 ENCOUNTER — Other Ambulatory Visit: Payer: Self-pay

## 2019-09-04 ENCOUNTER — Encounter: Payer: Self-pay | Admitting: Emergency Medicine

## 2019-09-04 DIAGNOSIS — K115 Sialolithiasis: Secondary | ICD-10-CM | POA: Insufficient documentation

## 2019-09-04 DIAGNOSIS — F17210 Nicotine dependence, cigarettes, uncomplicated: Secondary | ICD-10-CM | POA: Insufficient documentation

## 2019-09-04 DIAGNOSIS — I10 Essential (primary) hypertension: Secondary | ICD-10-CM | POA: Insufficient documentation

## 2019-09-04 LAB — CBC WITH DIFFERENTIAL/PLATELET
Abs Immature Granulocytes: 0.01 10*3/uL (ref 0.00–0.07)
Basophils Absolute: 0 10*3/uL (ref 0.0–0.1)
Basophils Relative: 1 %
Eosinophils Absolute: 0.2 10*3/uL (ref 0.0–0.5)
Eosinophils Relative: 5 %
HCT: 33.7 % — ABNORMAL LOW (ref 36.0–46.0)
Hemoglobin: 9.2 g/dL — ABNORMAL LOW (ref 12.0–15.0)
Immature Granulocytes: 0 %
Lymphocytes Relative: 32 %
Lymphs Abs: 1.5 10*3/uL (ref 0.7–4.0)
MCH: 19.2 pg — ABNORMAL LOW (ref 26.0–34.0)
MCHC: 27.3 g/dL — ABNORMAL LOW (ref 30.0–36.0)
MCV: 70.4 fL — ABNORMAL LOW (ref 80.0–100.0)
Monocytes Absolute: 0.7 10*3/uL (ref 0.1–1.0)
Monocytes Relative: 14 %
Neutro Abs: 2.4 10*3/uL (ref 1.7–7.7)
Neutrophils Relative %: 48 %
Platelets: 486 10*3/uL — ABNORMAL HIGH (ref 150–400)
RBC: 4.79 MIL/uL (ref 3.87–5.11)
RDW: 20.3 % — ABNORMAL HIGH (ref 11.5–15.5)
WBC: 4.9 10*3/uL (ref 4.0–10.5)
nRBC: 0 % (ref 0.0–0.2)

## 2019-09-04 LAB — BASIC METABOLIC PANEL
Anion gap: 8 (ref 5–15)
BUN: 9 mg/dL (ref 6–20)
CO2: 28 mmol/L (ref 22–32)
Calcium: 9.8 mg/dL (ref 8.9–10.3)
Chloride: 103 mmol/L (ref 98–111)
Creatinine, Ser: 0.65 mg/dL (ref 0.44–1.00)
GFR calc Af Amer: >60 mL/min (ref >60)
GFR calc non Af Amer: >60 mL/min (ref >60)
Glucose, Bld: 114 mg/dL — ABNORMAL HIGH (ref 70–99)
Potassium: 3.9 mmol/L (ref 3.5–5.1)
Sodium: 139 mmol/L (ref 135–145)

## 2019-09-04 MED ORDER — CEPHALEXIN 500 MG PO CAPS: 500.0000 mg | ORAL_CAPSULE | Freq: Three times a day (TID) | ORAL | 0 refills | Status: DC

## 2019-09-04 MED ORDER — HYDROCODONE-ACETAMINOPHEN 5-325 MG PO TABS: 1.0000 | ORAL_TABLET | Freq: Four times a day (QID) | ORAL | 0 refills | Status: DC | PRN

## 2019-09-04 NOTE — ED Provider Notes (Signed)
[facility] Emergency Department Provider Note  ____________________________________________   First MD Initiated Contact with Patient 09/04/19 1302     (approximate)  I have reviewed the triage vital signs and the nursing notes.   HISTORY  Chief Complaint Lymphadenopathy   HPI Brandi Nichols is a 44 y.o. female presents to the ED with complaint of left lymph node swollen and tenderness with a lymph node at her left ear.  Patient denies any difficulty swallowing or speaking.  Patient denies any fever or chills.  She states that when she is eating the area under her chin enlarges and then goes down.  She rates her pain as 10/10.     Past Medical History:  Diagnosis Date  . Hypertension     There are no problems to display for this patient.   Past Surgical History:  Procedure Laterality Date  . CHOLECYSTECTOMY      Prior to Admission medications   Medication Sig Start Date End Date Taking? Authorizing Provider  cephALEXin (KEFLEX) 500 MG capsule Take 1 capsule (500 mg total) by mouth 3 (three) times daily. 09/04/19   Johnn Hai, PA-C  HYDROcodone-acetaminophen (NORCO/VICODIN) 5-325 MG tablet Take 1 tablet by mouth every 6 (six) hours as needed for moderate pain. 09/04/19   Johnn Hai, PA-C    Allergies Patient has no known allergies.  History reviewed. No pertinent family history.  Social History Social History   Tobacco Use  . Smoking status: Current Some Day Smoker    Types: Cigarettes  . Smokeless tobacco: Never Used  Substance Use Topics  . Alcohol use: Yes    Alcohol/week: 3.0 standard drinks    Types: 3 Standard drinks or equivalent per week  . Drug use: No    Review of Systems Constitutional: No fever/chills Eyes: No visual changes. ENT: No sore throat.  Questionable lymph node enlargement. Cardiovascular: Denies chest pain. Respiratory: Denies shortness of breath. Gastrointestinal:  No nausea, no vomiting.      Genitourinary: History of anemia secondary to heavy menses. Musculoskeletal: Negative for muscle skeletal pain. Skin: Negative for rash. Neurological: Negative for headaches, focal weakness or numbness. ____________________________________________   PHYSICAL EXAM:  VITAL SIGNS: ED Triage Vitals  Enc Vitals Group     BP 09/04/19 1240 (!) 160/82     Pulse Rate 09/04/19 1240 78     Resp 09/04/19 1240 16     Temp 09/04/19 1240 98.5 F (36.9 C)     Temp Source 09/04/19 1240 Oral     SpO2 09/04/19 1240 100 %     Weight 09/04/19 1238 186 lb (84.4 kg)     Height 09/04/19 1238 5\' 5"  (1.651 m)     Head Circumference --      Peak Flow --      Pain Score 09/04/19 1238 10     Pain Loc --      Pain Edu? --      Excl. in [place]? --     Constitutional: Alert and oriented. Well appearing and in no acute distress. Eyes: Conjunctivae are normal.  Head: Atraumatic. Nose: No congestion/rhinnorhea. Mouth/Throat: Mucous membranes are moist.  Oropharynx non-erythematous.  No oral edema.  Uvula is midline. Neck: No stridor.   Hematological/Lymphatic/Immunilogical: Small, tender, mobile postauricular lymph node present on the left side.  Submandibular area there is soft tenderness without erythema or warmth. Cardiovascular: Normal rate, regular rhythm. Grossly normal heart sounds.  Good peripheral circulation. Respiratory: Normal respiratory effort.  No  retractions. Lungs CTAB. Musculoskeletal: Moves upper and lower extremities with any difficulty. Neurologic:  Normal speech and language. No gross focal neurologic deficits are appreciated.  Skin:  Skin is warm, dry and intact. No rash noted. Psychiatric: Mood and affect are normal. Speech and behavior are normal.  ____________________________________________   LABS (all labs ordered are listed, but only abnormal results are displayed)  Labs Reviewed  CBC WITH DIFFERENTIAL/PLATELET - Abnormal; Notable for the following components:      Result  Value   Hemoglobin 9.2 (*)    HCT 33.7 (*)    MCV 70.4 (*)    MCH 19.2 (*)    MCHC 27.3 (*)    RDW 20.3 (*)    Platelets 486 (*)    All other components within normal limits  BASIC METABOLIC PANEL - Abnormal; Notable for the following components:   Glucose, Bld 114 (*)    All other components within normal limits     PROCEDURES  Procedure(s) performed (including Critical Care):  Procedures   ____________________________________________   INITIAL IMPRESSION / ASSESSMENT AND PLAN / ED COURSE  As part of my medical decision making, I reviewed the following data within the electronic MEDICAL RECORD NUMBER Notes from prior ED visits and [place] Controlled Substance Database  44 year old female presents to the ED with complaint of swollen lymph nodes mostly on the left side of her neck.  Patient states that she has not had any fever or chills.  She states that when she eats the area under her chin enlarges and eventually goes down.  Patient denies any dental pain.  Labs were done and reassuring however patient's hemoglobin hematocrit were low.  Patient states that this is a chronic issue due to her heavy menses and her hemoglobin has been as low as 6.5.  Currently she is not taking any iron supplements.  She has an OB/GYN that she sees and has an appointment with.  Patient was placed on Keflex 500 mg 3 times daily and Norco if needed for pain.  She is encouraged to get some hard candies especially sour including lemon to help speed up the process.  Patient is instructed return to the emergency department if any severe worsening of her symptoms or urgent concerns. ____________________________________________   FINAL CLINICAL IMPRESSION(S) / ED DIAGNOSES  Final diagnoses:  Stone of salivary gland     ED Discharge Orders         Ordered    cephALEXin (KEFLEX) 500 MG capsule  3 times daily     09/04/19 1411    HYDROcodone-acetaminophen (NORCO/VICODIN) 5-325 MG tablet  Every 6 hours PRN      09/04/19 1411           Note:  This document was prepared using Dragon voice recognition software and may include unintentional dictation errors.    Johnn Hai, PA-C 09/04/19 1529    Nance Pear, MD 09/04/19 [phone]

## 2019-09-04 NOTE — ED Notes (Signed)
Pt with left neck lymph nodes swollen. C/o left ear pain. Denies sore throat, airway swelling. Throat slightly swollen.

## 2019-09-04 NOTE — Discharge Instructions (Signed)
Follow-up with Dr. Richardson Landry at [facility]NT if any continued problems.  Begin taking the antibiotic until completely finished.  Also there is pain medication if needed.  Also to speed up this process if you will get any sour candy including lemon drops this will increase production of saliva and possibly push the stone out if this is involved.  Return to the emergency department if any severe worsening of your symptoms such as fever, chills, nausea or vomiting.  Also return if you are unable to take the antibiotic.

## 2019-09-04 NOTE — ED Triage Notes (Signed)
Pt here for lymphadenopathy.  Swollen lymph node noted to submandibular area and left post auricular.  Painful on exam.  No fever or sore throat. Denies any recent illness.  No other symptoms.

## 2019-11-06 ENCOUNTER — Ambulatory Visit: Payer: Self-pay | Attending: Oncology

## 2020-01-07 ENCOUNTER — Ambulatory Visit
Admission: RE | Admit: 2020-01-07 | Discharge: 2020-01-07 | Disposition: A | Discharge status: Home / Self Care | Payer: Self-pay | Source: Ambulatory Visit | Attending: Family Medicine | Admitting: Family Medicine

## 2020-01-07 ENCOUNTER — Other Ambulatory Visit: Payer: Self-pay | Admitting: Family Medicine

## 2020-01-07 ENCOUNTER — Other Ambulatory Visit: Payer: Self-pay

## 2020-01-07 DIAGNOSIS — Z309 Encounter for contraceptive management, unspecified: Secondary | ICD-10-CM

## 2020-02-17 ENCOUNTER — Emergency Department: Payer: Self-pay

## 2020-02-17 ENCOUNTER — Emergency Department
Admission: EM | Admit: 2020-02-17 | Discharge: 2020-02-17 | Disposition: A | Discharge status: Home / Self Care | Payer: Self-pay | Attending: Emergency Medicine | Admitting: Emergency Medicine

## 2020-02-17 ENCOUNTER — Other Ambulatory Visit: Payer: Self-pay

## 2020-02-17 ENCOUNTER — Encounter: Payer: Self-pay | Admitting: Emergency Medicine

## 2020-02-17 DIAGNOSIS — F17210 Nicotine dependence, cigarettes, uncomplicated: Secondary | ICD-10-CM | POA: Insufficient documentation

## 2020-02-17 DIAGNOSIS — N938 Other specified abnormal uterine and vaginal bleeding: Secondary | ICD-10-CM

## 2020-02-17 DIAGNOSIS — I10 Essential (primary) hypertension: Secondary | ICD-10-CM | POA: Insufficient documentation

## 2020-02-17 DIAGNOSIS — N921 Excessive and frequent menstruation with irregular cycle: Secondary | ICD-10-CM

## 2020-02-17 DIAGNOSIS — N939 Abnormal uterine and vaginal bleeding, unspecified: Secondary | ICD-10-CM

## 2020-02-17 DIAGNOSIS — N920 Excessive and frequent menstruation with regular cycle: Secondary | ICD-10-CM | POA: Insufficient documentation

## 2020-02-17 LAB — CBC WITH DIFFERENTIAL/PLATELET
Abs Immature Granulocytes: 0.01 10*3/uL (ref 0.00–0.07)
Basophils Absolute: 0 10*3/uL (ref 0.0–0.1)
Basophils Relative: 1 %
Eosinophils Absolute: 0.1 10*3/uL (ref 0.0–0.5)
Eosinophils Relative: 2 %
HCT: 27.1 % — ABNORMAL LOW (ref 36.0–46.0)
Hemoglobin: 7.8 g/dL — ABNORMAL LOW (ref 12.0–15.0)
Immature Granulocytes: 0 %
Lymphocytes Relative: 39 %
Lymphs Abs: 2.2 10*3/uL (ref 0.7–4.0)
MCH: 19.7 pg — ABNORMAL LOW (ref 26.0–34.0)
MCHC: 28.8 g/dL — ABNORMAL LOW (ref 30.0–36.0)
MCV: 68.4 fL — ABNORMAL LOW (ref 80.0–100.0)
Monocytes Absolute: 0.7 10*3/uL (ref 0.1–1.0)
Monocytes Relative: 12 %
Neutro Abs: 2.5 10*3/uL (ref 1.7–7.7)
Neutrophils Relative %: 46 %
Platelets: 275 10*3/uL (ref 150–400)
RBC: 3.96 MIL/uL (ref 3.87–5.11)
RDW: 19.9 % — ABNORMAL HIGH (ref 11.5–15.5)
Smear Review: NORMAL
WBC: 5.5 10*3/uL (ref 4.0–10.5)
nRBC: 0 % (ref 0.0–0.2)

## 2020-02-17 LAB — BASIC METABOLIC PANEL
Anion gap: 8 (ref 5–15)
BUN: 7 mg/dL (ref 6–20)
CO2: 25 mmol/L (ref 22–32)
Calcium: 9.3 mg/dL (ref 8.9–10.3)
Chloride: 104 mmol/L (ref 98–111)
Creatinine, Ser: 0.75 mg/dL (ref 0.44–1.00)
GFR calc Af Amer: >60 mL/min (ref >60)
GFR calc non Af Amer: >60 mL/min (ref >60)
Glucose, Bld: 99 mg/dL (ref 70–99)
Potassium: 3.8 mmol/L (ref 3.5–5.1)
Sodium: 137 mmol/L (ref 135–145)

## 2020-02-17 MED ORDER — MEDROXYPROGESTERONE ACETATE 10 MG PO TABS
20.0000 mg | ORAL_TABLET | Freq: Three times a day (TID) | ORAL | 0 refills | Status: DC | PRN
Start: 2020-02-17 — End: 2020-03-06

## 2020-02-17 MED ORDER — MEDROXYPROGESTERONE ACETATE 10 MG PO TABS
20.0000 mg | ORAL_TABLET | Freq: Every day | ORAL | Status: DC
  Administered 2020-02-17: 20 mg via ORAL
  Filled 2020-02-17: qty 2

## 2020-02-17 NOTE — ED Notes (Signed)
Pt given warm blanket.

## 2020-02-17 NOTE — ED Notes (Signed)
Pt up to bedside toilet to provide urine sample.

## 2020-02-17 NOTE — ED Notes (Signed)
Pt leaving for imaging.

## 2020-02-17 NOTE — ED Notes (Signed)
Pt resting calmly in bed.  

## 2020-02-17 NOTE — ED Provider Notes (Signed)
Brandi Nichols  Time seen: 3:23 PM  I have reviewed the triage vital signs and the nursing notes.   HISTORY  Chief Complaint Vaginal Bleeding   HPI Brandi Nichols is a 44 y.o. female with a past medical history of menorrhagia presents to the emergency department for vaginal bleeding.  According to the patient for the past 2 to 3 months she has had fairly constant although light vaginal bleeding.  For the past 3 days she has had heavy vaginal bleeding and now is having symptoms of weakness and lightheadedness.  States very light lower abdominal cramping.  Patient states she has had similar episodes in the past has required a blood transfusion once previously.  Patient sees a PCP only with no OB/GYN specialty follow-up.  Past Medical History:  Diagnosis Date  . Hypertension     There are no problems to display for this patient.   Past Surgical History:  Procedure Laterality Date  . CHOLECYSTECTOMY      Prior to Admission medications   Medication Sig Start Date End Date Taking? Authorizing Provider  cephALEXin (KEFLEX) 500 MG capsule Take 1 capsule (500 mg total) by mouth 3 (three) times daily. 09/04/19   Brandi Hai, PA-C  HYDROcodone-acetaminophen (NORCO/VICODIN) 5-325 MG tablet Take 1 tablet by mouth every 6 (six) hours as needed for moderate pain. 09/04/19   Brandi Hai, PA-C    No Known Allergies  No family history on file.  Social History Social History   Tobacco Use  . Smoking status: Current Some Day Smoker    Types: Cigarettes  . Smokeless tobacco: Never Used  Substance Use Topics  . Alcohol use: Yes    Alcohol/week: 3.0 standard drinks    Types: 3 Standard drinks or equivalent per week  . Drug use: No    Review of Systems Constitutional: Negative for fever.  Generalized weakness. Cardiovascular: Negative for chest pain. Respiratory: Negative for shortness of breath. Gastrointestinal: Lower  abdominal cramping. Genitourinary: Heavy vaginal bleeding. Musculoskeletal: Negative for musculoskeletal complaints Neurological: Negative for headache All other ROS negative  ____________________________________________   PHYSICAL EXAM:  VITAL SIGNS: ED Triage Vitals  Enc Vitals Group     BP 02/17/20 1108 (!) 144/85     Pulse Rate 02/17/20 1108 72     Resp 02/17/20 1108 16     Temp 02/17/20 1108 97.6 F (36.4 C)     Temp Source 02/17/20 1108 Oral     SpO2 02/17/20 1108 100 %     Weight 02/17/20 1103 186 lb 1.1 oz (84.4 kg)     Height 02/17/20 1103 5\' 5"  (1.651 m)     Head Circumference --      Peak Flow --      Pain Score 02/17/20 1103 5     Pain Loc --      Pain Edu? --      Excl. in [place]? --    Constitutional: Alert and oriented. Well appearing and in no distress. Eyes: Normal exam ENT      Head: Normocephalic and atraumatic.      Mouth/Throat: Mucous membranes are moist. Cardiovascular: Normal rate, regular rhythm.  Respiratory: Normal respiratory effort without tachypnea nor retractions. Breath sounds are clear Gastrointestinal: Soft, slight suprapubic tenderness palpation otherwise benign abdominal exam. Musculoskeletal: Nontender with normal range of motion in all extremities.  Neurologic:  Normal speech and language. No gross focal neurologic deficits Skin:  Skin is warm, dry and intact.  Psychiatric: Mood and affect are normal.   ____________________________________________   RADIOLOGY  IMPRESSION:  Poor demarcation of the a endometrial/myometrial junction and  globular configuration of the uterus suggestive of underlying  adenomyosis. Superimposed small uterine fibroids. This could be  confirmed with MRI examination, if indicated.   Trace free intraperitoneal fluid, possibly physiologic in a female  patient of this age.   Nonvisualization of the left ovary. No adnexal masses.   ____________________________________________   INITIAL IMPRESSION /  ASSESSMENT AND PLAN / ED COURSE  Pertinent labs & imaging results that were available during my care of the patient were reviewed by me and considered in my medical decision making (see chart for details).   Patient presents to the emergency department for heavy vaginal bleeding generalized weakness and lightheadedness.  Patient's labs have resulted showing hemoglobin of 7.8.  We will proceed with pelvic ultrasound.  We will likely discuss with OB/GYN once her ultrasound has completed.  Patient agreeable to plan of care.  Patient is not currently on birth control.  Ultrasound shows adenomyosis and possible small fibroids.  I spoke to Dr. Star Age of GYN who recommends starting the patient on Provera 20 mg 3 times daily x7 days and having the patient follow-up in his office tomorrow at 1:15 PM to discuss further work-up and management and possible hysterectomy in the future.  Patient agreeable to plan of care.  We will dose her first dose of her medication in the emergency department.  I discussed return precautions.  Patient states her bleeding has slowed considerably since this morning.  [name] was evaluated in Emergency Department on 02/17/2020 for the symptoms described in the history of present illness. She was evaluated in the context of the global COVID-19 pandemic, which necessitated consideration that the patient might be at risk for infection with the SARS-CoV-2 virus that causes COVID-19. Institutional protocols and algorithms that pertain to the evaluation of patients at risk for COVID-19 are in a state of rapid change based on information released by regulatory bodies including the CDC and federal and state organizations. These policies and algorithms were followed during the patient's care in the ED.  ____________________________________________   FINAL CLINICAL IMPRESSION(S) / ED DIAGNOSES  Menorrhagia    Harvest Dark, MD 02/17/20 1739
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
## 2020-02-17 NOTE — ED Triage Notes (Addendum)
C/O heavy vaginal bleeding with menses x 2 days. Patient states heavy periods have been an ongoing issue.  Had an endometrial biopsy in March for same.  Taking Iron pills.  AAOx3.  Skin warm and dry. NAD

## 2020-02-18 ENCOUNTER — Ambulatory Visit: Payer: Self-pay | Admitting: Obstetrics and Gynecology

## 2020-02-18 LAB — POCT PREGNANCY, URINE: Preg Test, Ur: NEGATIVE

## 2020-03-06 ENCOUNTER — Ambulatory Visit (INDEPENDENT_AMBULATORY_CARE_PROVIDER_SITE_OTHER): Payer: Self-pay | Admitting: Obstetrics and Gynecology

## 2020-03-06 ENCOUNTER — Other Ambulatory Visit: Payer: Self-pay

## 2020-03-06 ENCOUNTER — Encounter: Payer: Self-pay | Admitting: Obstetrics and Gynecology

## 2020-03-06 ENCOUNTER — Other Ambulatory Visit (HOSPITAL_COMMUNITY)
Admission: RE | Admit: 2020-03-06 | Discharge: 2020-03-06 | Disposition: A | Discharge status: Home / Self Care | Payer: Self-pay | Source: Ambulatory Visit | Attending: Obstetrics and Gynecology | Admitting: Obstetrics and Gynecology

## 2020-03-06 VITALS — BP 162/88 | HR 82 | Ht 65.5 in | Wt 192.0 lb

## 2020-03-06 DIAGNOSIS — Z124 Encounter for screening for malignant neoplasm of cervix: Secondary | ICD-10-CM | POA: Insufficient documentation

## 2020-03-06 DIAGNOSIS — D251 Intramural leiomyoma of uterus: Secondary | ICD-10-CM

## 2020-03-06 DIAGNOSIS — Z113 Encounter for screening for infections with a predominantly sexual mode of transmission: Secondary | ICD-10-CM

## 2020-03-06 DIAGNOSIS — N939 Abnormal uterine and vaginal bleeding, unspecified: Secondary | ICD-10-CM

## 2020-03-06 MED ORDER — MEDROXYPROGESTERONE ACETATE 10 MG PO TABS: 20.0000 mg | ORAL_TABLET | Freq: Every day | ORAL | 1 refills | Status: DC

## 2020-03-06 NOTE — Progress Notes (Signed)
Gynecology Abnormal Uterine Bleeding Initial Evaluation   Chief Complaint:  Chief Complaint  Patient presents with  . ER follow up    History of Present Illness:    Paitient is a 44 y.o. No obstetric history on file. who LMP was Patient's last menstrual period was 02/14/2020., presents today for a problem visit.  She complains of menometrorrhagia that  began past few months and its severity is described as severe.  The patient menstrual complaints are acute.  Associated with moderate menstrual cramping. She was anemic on recent ER evaluation, TVUS showing adenomyosis.  Reports prior endometrial biopsy at [facility].  The patient is sexually active. She currently uses nonefor contraception.  Was started on po provera 20mg  tid at time of ER visit with good response   Previous evaluation: TVUS Prior Diagnosis: AUB-A. Previous Treatment: po provera.  LMP: Patient's last menstrual period was 02/14/2020.  Paramter Normal / Abnormal Prsent  Frequency Am[place]hea     Infrequent (>38 days)     Normal (?24 days ?38 days)     Freequent (<24 days) X  Duration Normal (?8 days)     Prolonged (>8 days) X  Regularity Regular (shortest to longest cycle variation ?7-9 days)*     Irregular (shortest to longest cycle variation ?8-10days)* X  Flow Volume Light    (Self reported) Normal     Heavy X      Intermenstrual Bleeding None X   Random     Cyclical early     Cyclical mid     Cyclical late        Unscheduled Bleeding  Not applicable X  (exogenous hormones) Absent     Present     FIGO AUB I System: *The available evidence suggests that, using these criteria, the normal range (shortest to longest) varies with age: 60-25 y of age, ?15 d; 34-41 y, ?7 d; and for 26-45 y, ?9 d    Review of Systems: Review of Systems  Constitutional: Positive for malaise/fatigue. Negative for chills, diaphoresis, fever and weight loss.  Eyes: Negative for blurred vision.  Gastrointestinal: Positive for  abdominal pain. Negative for constipation, diarrhea, nausea and vomiting.  Genitourinary: Negative.   Neurological: Negative for headaches.  Endo/Heme/Allergies: Does not bruise/bleed easily.    Past Medical History:  There are no problems to display for this patient.   Past Surgical History:  Past Surgical History:  Procedure Laterality Date  . CHOLECYSTECTOMY      Obstetric History: No obstetric history on file.  Family History:  History reviewed. No pertinent family history.  Social History:  Social History   Socioeconomic History  . Marital status: Single    Spouse name: Not on file  . Number of children: Not on file  . Years of education: Not on file  . Highest education level: Not on file  Occupational History  . Not on file  Tobacco Use  . Smoking status: Current Some Day Smoker    Types: Cigarettes  . Smokeless tobacco: Never Used  Substance and Sexual Activity  . Alcohol use: Yes    Alcohol/week: 3.0 standard drinks    Types: 3 Standard drinks or equivalent per week  . Drug use: No  . Sexual activity: Yes    Birth control/protection: None    Comment: Tubal Ligation  Other Topics Concern  . Not on file  Social History Narrative  . Not on file   Social Determinants of Health   Financial Resource Strain:   .  Difficulty of Paying Living Expenses:   Food Insecurity:   . Worried About Charity fundraiser in the Last Year:   . Arboriculturist in the Last Year:   Transportation Needs:   . Film/video editor (Medical):   Marland Kitchen Lack of Transportation (Non-Medical):   Physical Activity:   . Days of Exercise per Week:   . Minutes of Exercise per Session:   Stress:   . Feeling of Stress :   Social Connections:   . Frequency of Communication with Friends and Family:   . Frequency of Social Gatherings with Friends and Family:   . Attends Religious Services:   . Active Member of Clubs or Organizations:   . Attends Archivist Meetings:   Marland Kitchen  Marital Status:   Intimate Partner Violence:   . Fear of Current or Ex-Partner:   . Emotionally Abused:   Marland Kitchen Physically Abused:   . Sexually Abused:     Allergies:  No Known Allergies  Medications: Prior to Admission medications   Medication Sig Start Date End Date Taking? Authorizing Provider  cephALEXin (KEFLEX) 500 MG capsule Take 1 capsule (500 mg total) by mouth 3 (three) times daily. 09/04/19  Yes Johnn Hai, PA-C  HYDROcodone-acetaminophen (NORCO/VICODIN) 5-325 MG tablet Take 1 tablet by mouth every 6 (six) hours as needed for moderate pain. 09/04/19  Yes Johnn Hai, PA-C  medroxyPROGESTERone (PROVERA) 10 MG tablet Take 2 tablets (20 mg total) by mouth 3 (three) times daily as needed. 02/17/20  Yes Harvest Dark, MD    Physical Exam Blood pressure (!) 162/88, pulse 82, height 5' 5.5" (1.664 m), weight 192 lb (87.1 kg), last menstrual period 02/14/2020.  Patient's last menstrual period was 02/14/2020.  General: NAD HEENT: normocephalic, anicteric Pulmonary: No increased work of breathing Abdomen: soft, non-tender, non-distended.  Umbilicus without lesions.  No hepatomegaly, splenomegaly or masses palpable. No evidence of hernia  Genitourinary:  External: Normal external female genitalia.  Normal urethral meatus, normal Bartholin's and Skene's glands.    Vagina: Normal vaginal mucosa, no evidence of prolapse.    Cervix: Grossly normal in appearance, no bleeding  Uterus: Non-enlarged, mobile, normal contour.  No CMT  Adnexa: ovaries non-enlarged, no adnexal masses  Rectal: deferred  Lymphatic: no evidence of inguinal lymphadenopathy Extremities: no edema, erythema, or tenderness Neurologic: Grossly intact Psychiatric: mood appropriate, affect full  Female chaperone present for pelvic portions of the physical exam  US PELVIC COMPLETE WITH TRANSVAGINAL  Result Date: 02/17/2020 CLINICAL DATA:  Menorrhagia EXAM: TRANSABDOMINAL AND TRANSVAGINAL ULTRASOUND OF  PELVIS TECHNIQUE: Both transabdominal and transvaginal ultrasound examinations of the pelvis were performed. Transabdominal technique was performed for global imaging of the pelvis including uterus, ovaries, adnexal regions, and pelvic cul-de-sac. It was necessary to proceed with endovaginal exam following the transabdominal exam to visualize the ovaries bilaterally. COMPARISON:  02/20/2008 FINDINGS: Uterus Measurements: 12.6 x 6.7 x 6.6 cm = volume: 288 mL. The uterus is retroverted. In the myometrial/endometrial junction is poorly demarcated within the uterine fundus which appears globular in configuration, a finding that suggests underlying adenomyosis. At least 2 heterogeneously hypoechoic intramural masses are seen within the anterior body and fundus of the uterus measuring up to 2.3 by 2.4 x 2.2 cm in keeping with multiple intramural fibroids. Endometrium Thickness: Endometrial thickness is difficult to accurately assess due to poor demarcation within the uterine fundus, likely related to underlying adenomyosis, however, appears of normal thickness within the lower uterine segment measuring approximately 7 mm in  thickness. No focal abnormality visualized. Right ovary Measurements: 2.7 x 3.1 x 3.1 cm = volume: 13.5 mL. The right ovary is normal in size and echogenicity. Multiple follicles are seen within the right ovary. No inferior in or adnexal masses are seen. Left ovary The left ovary is not visualized.  No adnexal masses are seen. Other findings A a small amount of simple appearing free fluid is seen within the pelvis. IMPRESSION: Poor demarcation of the a endometrial/myometrial junction and globular configuration of the uterus suggestive of underlying adenomyosis. Superimposed small uterine fibroids. This could be confirmed with MRI examination, if indicated. Trace free intraperitoneal fluid, possibly physiologic in a female patient of this age. Nonvisualization of the left ovary.  No adnexal masses.  Electronically Signed   By: Fidela Salisbury MD   On: 02/17/2020 17:14     Assessment: 44 y.o. No obstetric history on file. with abnormal uterine bleeding  Plan: Problem List Items Addressed This Visit    None    Visit Diagnoses    Abnormal uterine bleeding    -  Primary   Relevant Orders   CBC (Completed)   TSH (Completed)   Prolactin (Completed)   Intramural leiomyoma of uterus       Cervical cancer screening       Relevant Orders   Cytology - PAP   Routine screening for STI (sexually transmitted infection)       Relevant Orders   Cytology - PAP      1) Discussed management options for abnormal uterine bleeding including expectant, NSAIDs, tranexamic acid (Lysteda), oral progesterone (Provera, norethindrone, megace), Depo Provera, Levonorgestrel containing IUD, endometrial ablation (Novasure) or hysterectomy as definitive surgical management.  Discussed risks and benefits of each method.   Final management decision will hinge on results of patient's work up and whether an underlying etiology for the patients bleeding symptoms can be discerned.  We will conduct a basic work up examining using the PALM-COIEN classification system.  In the meantime the patient opts to trial provera while we await results of her ultrasound and labs.  The role of unopposed estrogen in the development of endometrial hyperplasia or carcinoma is discussed.  The risk of endometrial hyperplasia is linearly correlated with increasing BMI given the production of estrone by adipose tissue. .  Bleeding precautions reviewed.  - Based on ultrasound AUB-A - pap obtain - records charles drew endometrial biopsy - tried and failed IUD in the past - CBC, TSH, and prolactin today - decrease provera to 20mg  daily from TID dosing  2) Return Sign release of information charles drew endometrial biopsy, for Will call patient once biopsy results reviewed.   Malachy Mood, MD, [facility], [facility] 03/06/2020, 2:28 PM

## 2020-03-07 LAB — CBC
Hematocrit: 25.9 % — ABNORMAL LOW (ref 34.0–46.6)
Hemoglobin: 7.2 g/dL — ABNORMAL LOW (ref 11.1–15.9)
MCH: 19.2 pg — ABNORMAL LOW (ref 26.6–33.0)
MCHC: 27.8 g/dL — ABNORMAL LOW (ref 31.5–35.7)
MCV: 69 fL — ABNORMAL LOW (ref 79–97)
Platelets: 309 10*3/uL (ref 150–450)
RBC: 3.75 x10E6/uL — ABNORMAL LOW (ref 3.77–5.28)
RDW: 18 % — ABNORMAL HIGH (ref 11.7–15.4)
WBC: 5 10*3/uL (ref 3.4–10.8)

## 2020-03-07 LAB — PROLACTIN: Prolactin: 16.2 ng/mL (ref 4.8–23.3)

## 2020-03-07 LAB — TSH: TSH: 0.855 u[IU]/mL (ref 0.450–4.500)

## 2020-03-12 LAB — CYTOLOGY - PAP
Chlamydia: NEGATIVE
Comment: NEGATIVE
Comment: NEGATIVE
Comment: NORMAL
Diagnosis: NEGATIVE
High risk HPV: NEGATIVE
Neisseria Gonorrhea: NEGATIVE

## 2020-03-27 ENCOUNTER — Encounter: Payer: Self-pay | Admitting: Obstetrics and Gynecology

## 2020-03-27 NOTE — Progress Notes (Signed)
Needs visit for endometrial biopsy sometime in October

## 2020-03-27 NOTE — Progress Notes (Unsigned)
Records Brandi Nichols 11/25/2019 Endometrial Biopsy simple hyperplasia without atypia Mirena IUD 10/29/2013 removed 2016 after non-responsive EMBx 10/2013 Chronic endometritis S/P BTL

## 2020-05-13 ENCOUNTER — Ambulatory Visit: Payer: Self-pay | Admitting: Obstetrics and Gynecology

## 2020-06-02 ENCOUNTER — Ambulatory Visit: Payer: Self-pay | Admitting: Obstetrics and Gynecology

## 2020-07-04 ENCOUNTER — Emergency Department: Payer: No Typology Code available for payment source

## 2020-07-04 ENCOUNTER — Emergency Department
Admission: EM | Admit: 2020-07-04 | Discharge: 2020-07-04 | Disposition: A | Discharge status: Home / Self Care | Payer: No Typology Code available for payment source | Attending: Emergency Medicine | Admitting: Emergency Medicine

## 2020-07-04 ENCOUNTER — Other Ambulatory Visit: Payer: Self-pay

## 2020-07-04 ENCOUNTER — Encounter: Payer: Self-pay | Admitting: Physician Assistant

## 2020-07-04 DIAGNOSIS — S34109A Unspecified injury to unspecified level of lumbar spinal cord, initial encounter: Secondary | ICD-10-CM | POA: Diagnosis present

## 2020-07-04 DIAGNOSIS — I10 Essential (primary) hypertension: Secondary | ICD-10-CM | POA: Diagnosis not present

## 2020-07-04 DIAGNOSIS — S5011XA Contusion of right forearm, initial encounter: Secondary | ICD-10-CM | POA: Insufficient documentation

## 2020-07-04 DIAGNOSIS — F17210 Nicotine dependence, cigarettes, uncomplicated: Secondary | ICD-10-CM | POA: Diagnosis not present

## 2020-07-04 DIAGNOSIS — Y92410 Unspecified street and highway as the place of occurrence of the external cause: Secondary | ICD-10-CM | POA: Diagnosis not present

## 2020-07-04 DIAGNOSIS — S39012A Strain of muscle, fascia and tendon of lower back, initial encounter: Secondary | ICD-10-CM | POA: Diagnosis not present

## 2020-07-04 LAB — POC URINE PREG, ED: Preg Test, Ur: NEGATIVE

## 2020-07-04 MED ORDER — HYDROCODONE-ACETAMINOPHEN 5-325 MG PO TABS
1.0000 | ORAL_TABLET | Freq: Once | ORAL | Status: AC
  Administered 2020-07-04: 1 via ORAL
  Filled 2020-07-04: qty 1

## 2020-07-04 MED ORDER — KETOROLAC TROMETHAMINE 10 MG PO TABS
10.0000 mg | ORAL_TABLET | Freq: Three times a day (TID) | ORAL | 0 refills | Status: DC
Start: 2020-07-04 — End: 2023-09-13

## 2020-07-04 MED ORDER — KETOROLAC TROMETHAMINE 30 MG/ML IJ SOLN
30.0000 mg | Freq: Once | INTRAMUSCULAR | Status: AC
  Administered 2020-07-04: 30 mg via INTRAMUSCULAR
  Filled 2020-07-04: qty 1

## 2020-07-04 MED ORDER — CYCLOBENZAPRINE HCL 5 MG PO TABS
5.0000 mg | ORAL_TABLET | Freq: Three times a day (TID) | ORAL | 0 refills | Status: DC | PRN
Start: 2020-07-04 — End: 2023-09-13
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
## 2021-04-20 IMAGING — CR DG LUMBAR SPINE 2-3V
1 series · 3 of 3 positions shown · non-contrast
Comparison: None.

CLINICAL DATA: Pain

EXAM:
LUMBAR SPINE - 2-3 VIEW

[Series 1: dg lumbar spine 2-3 views · 0.14mm/px · 3 of 3 slices shown]
[im 1/3]
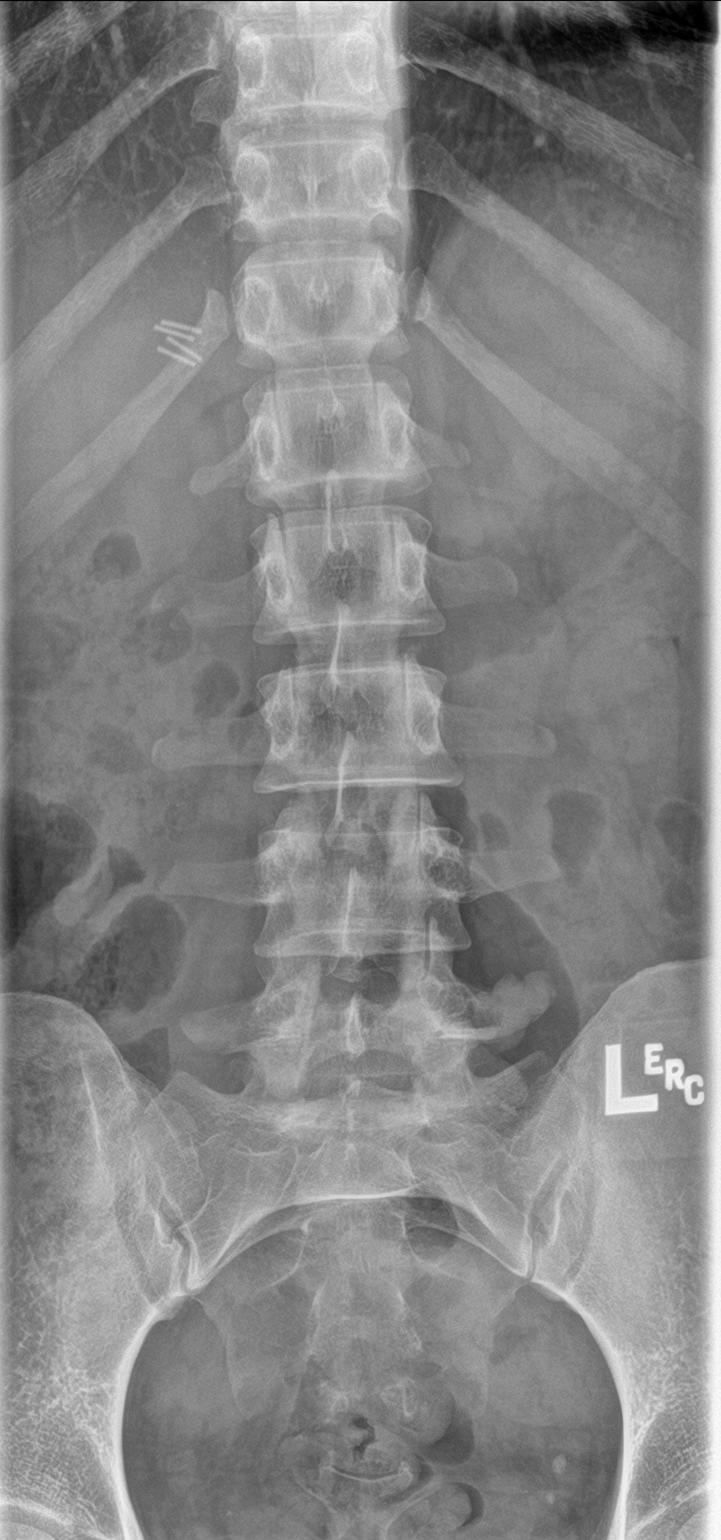
[im 2/3]
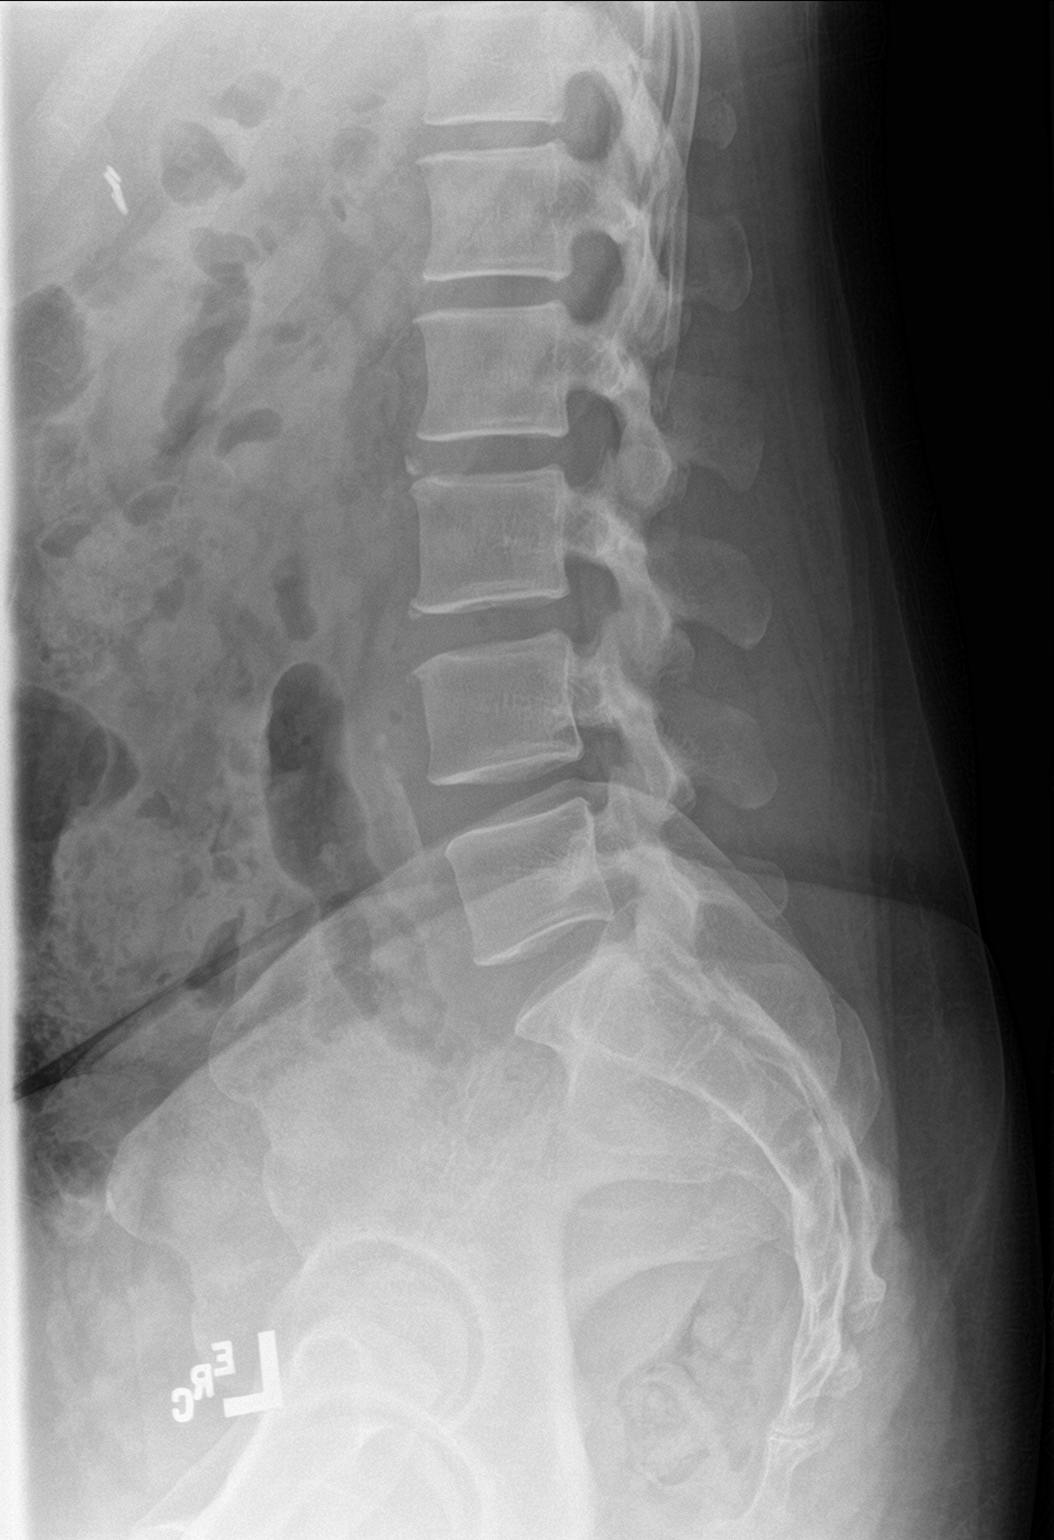
[im 3/3]
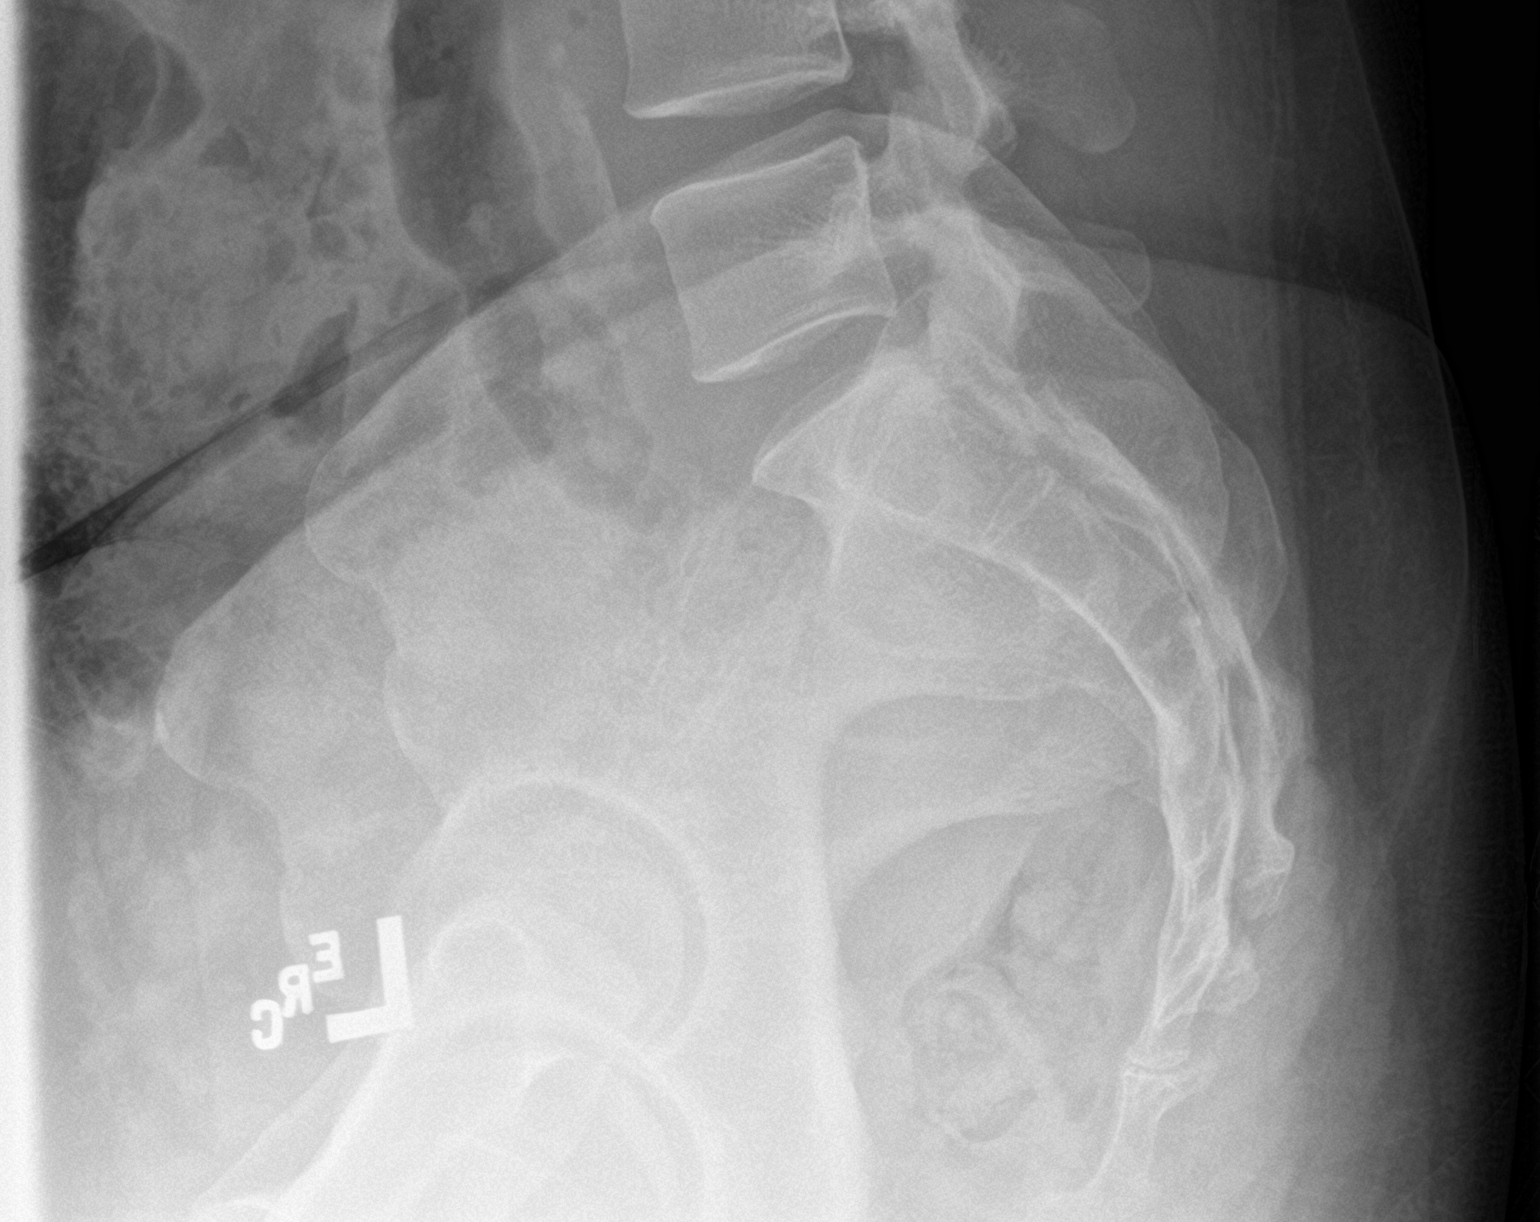

[3 of 3 positions shown; findings below may reference images not displayed]

FINDINGS: There is no evidence of lumbar spine fracture. Alignment is normal.
Intervertebral disc spaces are maintained.
IMPRESSION: Negative.

## 2021-06-23 ENCOUNTER — Encounter: Payer: Self-pay | Admitting: Emergency Medicine

## 2021-06-23 ENCOUNTER — Emergency Department
Admission: EM | Admit: 2021-06-23 | Discharge: 2021-06-23 | Disposition: A | Discharge status: Home / Self Care | Payer: Self-pay | Attending: Emergency Medicine | Admitting: Emergency Medicine

## 2021-06-23 ENCOUNTER — Emergency Department: Payer: Self-pay

## 2021-06-23 ENCOUNTER — Other Ambulatory Visit: Payer: Self-pay

## 2021-06-23 DIAGNOSIS — F17210 Nicotine dependence, cigarettes, uncomplicated: Secondary | ICD-10-CM | POA: Insufficient documentation

## 2021-06-23 DIAGNOSIS — I10 Essential (primary) hypertension: Secondary | ICD-10-CM | POA: Insufficient documentation

## 2021-06-23 DIAGNOSIS — Z20822 Contact with and (suspected) exposure to covid-19: Secondary | ICD-10-CM | POA: Insufficient documentation

## 2021-06-23 DIAGNOSIS — J209 Acute bronchitis, unspecified: Secondary | ICD-10-CM | POA: Insufficient documentation

## 2021-06-23 LAB — RESP PANEL BY RT-PCR (FLU A&B, COVID) ARPGX2
Influenza A by PCR: NEGATIVE
Influenza B by PCR: NEGATIVE
SARS Coronavirus 2 by RT PCR: NEGATIVE

## 2021-06-23 MED ORDER — AZITHROMYCIN 250 MG PO TABS
ORAL_TABLET | ORAL | 0 refills | Status: DC
Start: 2021-06-23 — End: 2023-09-13

## 2021-06-23 MED ORDER — BENZONATATE 100 MG PO CAPS: 100.0000 mg | ORAL_CAPSULE | Freq: Three times a day (TID) | ORAL | 0 refills | Status: AC | PRN

## 2021-06-23 MED ORDER — PREDNISONE 10 MG (21) PO TBPK
ORAL_TABLET | ORAL | 0 refills | Status: DC
Start: 2021-06-23 — End: 2023-09-13

## 2021-06-23 NOTE — ED Provider Notes (Signed)
Emergency Medicine Provider Triage Evaluation Note  Brandi Nichols , a 45 y.o. female  was evaluated in triage.  Pt complains of URI symptoms.  Patient was sent home from work and will need a work note to return.  Review of Systems  Positive: Fever, cough, congestion and body aches Negative: No chest pain, shortness of breath, nausea/vomiting/diarrhea  Physical Exam  BP (!) 186/97 (BP Location: Left Arm)   Pulse 90   Temp (!) 97.5 F (36.4 C) (Oral)   Resp 17   Ht 5\' 5"  (1.651 m)   Wt 83 kg   LMP 06/09/2021   SpO2 100%   BMI 30.45 kg/m  Gen:   Awake, no distress   Resp:  Normal effort  MSK:   Moves extremities without difficulty  Other:    Medical Decision Making  Medically screening exam initiated at 1:34 PM.  Appropriate orders placed.  Brandi Nichols was informed that the remainder of the evaluation will be completed by another provider, this initial triage assessment does not replace that evaluation, and the importance of remaining in the ED until their evaluation is complete.     Versie Starks, PA-C 06/23/21 1335    Naaman Plummer, MD 06/23/21 [phone]

## 2021-06-23 NOTE — ED Provider Notes (Signed)
[facility] Emergency Department Provider Note  ____________________________________________   Event Date/Time   First MD Initiated Contact with Patient 06/23/21 1608     (approximate)  I have reviewed the triage vital signs and the nursing notes.   HISTORY  Chief Complaint Cough, Fever, Nasal Congestion, and Generalized Body Aches    HPI Brandi Nichols is a 45 y.o. female flulike symptoms, patient is complained of fever, chills, body aches.  cough, sore throat, denies vomiting, diarrhea; denies chest pain or sob.  Sx for 4 days.  Patient states that her boss sent her here as she thought she was sick.    Past Medical History:  Diagnosis Date   Hypertension     There are no problems to display for this patient.   Past Surgical History:  Procedure Laterality Date   CHOLECYSTECTOMY      Prior to Admission medications   Medication Sig Start Date End Date Taking? Authorizing Provider  azithromycin (ZITHROMAX Z-PAK) 250 MG tablet 2 pills today then 1 pill a day for 4 days 06/23/21  Yes Heba Ige, Linden Dolin, PA-C  benzonatate (TESSALON PERLES) 100 MG capsule Take 1 capsule (100 mg total) by mouth 3 (three) times daily as needed for cough. 06/23/21 06/23/22 Yes Remijio Holleran, Linden Dolin, PA-C  predniSONE (STERAPRED UNI-PAK 21 TAB) 10 MG (21) TBPK tablet Take 6 pills on day one then decrease by 1 pill each day 06/23/21  Yes Pryor Guettler, Linden Dolin, PA-C  cyclobenzaprine (FLEXERIL) 5 MG tablet Take 1 tablet (5 mg total) by mouth 3 (three) times daily as needed. 07/04/20   Menshew, Dannielle Karvonen, PA-C  ketorolac (TORADOL) 10 MG tablet Take 1 tablet (10 mg total) by mouth every 8 (eight) hours. 07/04/20   Menshew, Dannielle Karvonen, PA-C  medroxyPROGESTERone (PROVERA) 10 MG tablet Take 2 tablets (20 mg total) by mouth daily. 03/06/20   Malachy Mood, MD    Allergies Patient has no known allergies.  History reviewed. No pertinent family history.  Social History Social  History   Tobacco Use   Smoking status: Some Days    Types: Cigarettes   Smokeless tobacco: Never  Substance Use Topics   Alcohol use: Yes    Alcohol/week: 3.0 standard drinks    Types: 3 Standard drinks or equivalent per week   Drug use: No    Review of Systems  Constitutional: Positive fever/chills Eyes: No visual changes. ENT: Positive sore throat. Cardiovascular: Denies chest pain Respiratory: Positive cough Gastrointestinal: Denies abdominal pain, nausea/vomiting/diarrhea Genitourinary: Negative for dysuria. Musculoskeletal: Negative for back pain. Skin: Negative for rash.    ____________________________________________   PHYSICAL EXAM:  VITAL SIGNS: ED Triage Vitals  Enc Vitals Group     BP 06/23/21 1332 (!) 186/97     Pulse Rate 06/23/21 1332 90     Resp 06/23/21 1332 17     Temp 06/23/21 1332 (!) 97.5 F (36.4 C)     Temp Source 06/23/21 1332 Oral     SpO2 06/23/21 1332 100 %     Weight 06/23/21 1333 182 lb 15.7 oz (83 kg)     Height 06/23/21 1333 5\' 5"  (1.651 m)     Head Circumference --      Peak Flow --      Pain Score 06/23/21 1332 8     Pain Loc --      Pain Edu? --      Excl. in [place]? --     Constitutional: Alert and oriented.  Well appearing and in no acute distress. Eyes: Conjunctivae are normal.  Head: Atraumatic. Nose: No congestion/rhinnorhea. Mouth/Throat: Mucous membranes are moist.   Neck:  supple no lymphadenopathy noted Cardiovascular: Normal rate, regular rhythm. Heart sounds are normal GU: deferred Musculoskeletal: FROM all extremities, warm and well perfused Neurologic:  Normal speech and language.  Skin:  Skin is warm, dry and intact. No rash noted. Psychiatric: Mood and affect are normal. Speech and behavior are normal.  ____________________________________________   LABS (all labs ordered are listed, but only abnormal results are displayed)  Labs Reviewed  RESP PANEL BY RT-PCR (FLU A&B, COVID) [id number]    ____________________________________________   ____________________________________________  RADIOLOGY  Chest x-ray  ____________________________________________   PROCEDURES  Procedure(s) performed: No  Procedures    ____________________________________________   INITIAL IMPRESSION / ASSESSMENT AND PLAN / ED COURSE  Pertinent labs & imaging results that were available during my care of the patient were reviewed by me and considered in my medical decision making (see chart for details).   Patient is a 45 year old female presents with URI symptoms.  See HPI.  Physical exam shows patient per stable  In review of the respiratory panel is negative for COVID and influenza.  Chest x-ray reviewed by me confirmed by radiology be negative for pneumonia or any acute abnormality  I did discuss findings with the patient.  She was diagnosed of bronchitis.  Is given a antibiotic and steroid taper along with Tessalon Perles.  Work note was also given.  Follow-up with your regular doctor if not improving 3 days.  Return if worsening.  Discharged in stable condition     As part of my medical decision making, I reviewed the following data within the [place] notes reviewed and incorporated, Labs reviewed, Old chart reviewed, Radiograph reviewed , Notes from prior ED visits, and [place] Controlled Substance Database  ____________________________________________   FINAL CLINICAL IMPRESSION(S) / ED DIAGNOSES  Final diagnoses:  Acute bronchitis, unspecified organism      NEW MEDICATIONS STARTED DURING THIS VISIT:  New Prescriptions   AZITHROMYCIN (ZITHROMAX Z-PAK) 250 MG TABLET    2 pills today then 1 pill a day for 4 days   BENZONATATE (TESSALON PERLES) 100 MG CAPSULE    Take 1 capsule (100 mg total) by mouth 3 (three) times daily as needed for cough.   PREDNISONE (STERAPRED UNI-PAK 21 TAB) 10 MG (21) TBPK TABLET    Take 6 pills on day one then decrease by  1 pill each day    Brandi Nichols was evaluated in Emergency Department on 06/23/2021 for the symptoms described in the history of present illness. She was evaluated in the context of the global COVID-19 pandemic, which necessitated consideration that the patient might be at risk for infection with the SARS-CoV-2 virus that causes COVID-19. Institutional protocols and algorithms that pertain to the evaluation of patients at risk for COVID-19 are in a state of rapid change based on information released by regulatory bodies including the CDC and federal and state organizations. These policies and algorithms were followed during the patient's care in the ED.   Note:  This document was prepared using Dragon voice recognition software and may include unintentional dictation errors.    Versie Starks, PA-C 06/23/21 1723    Naaman Plummer, MD 06/23/21 [phone]

## 2021-06-23 NOTE — ED Triage Notes (Signed)
Pt comes into the ED via POV c/o cough, fever, congestions,and body aches since Saturday.  Pt in NAD with even and unlabored respirations.

## 2021-06-23 NOTE — Discharge Instructions (Signed)
Follow up with your regular doctor if not improving in 3 days. Take the medication as prescribed Return if worsening

## 2022-04-09 IMAGING — CR DG CHEST 2V
1 series · 2 of 2 positions shown · non-contrast
Comparison: None.

CLINICAL DATA: Cough, fever and congestion.

EXAM:
CHEST - 2 VIEW

[Series 1: dg chest 2 view · 0.14mm/px · 2 of 2 slices shown]
[im 1/2]
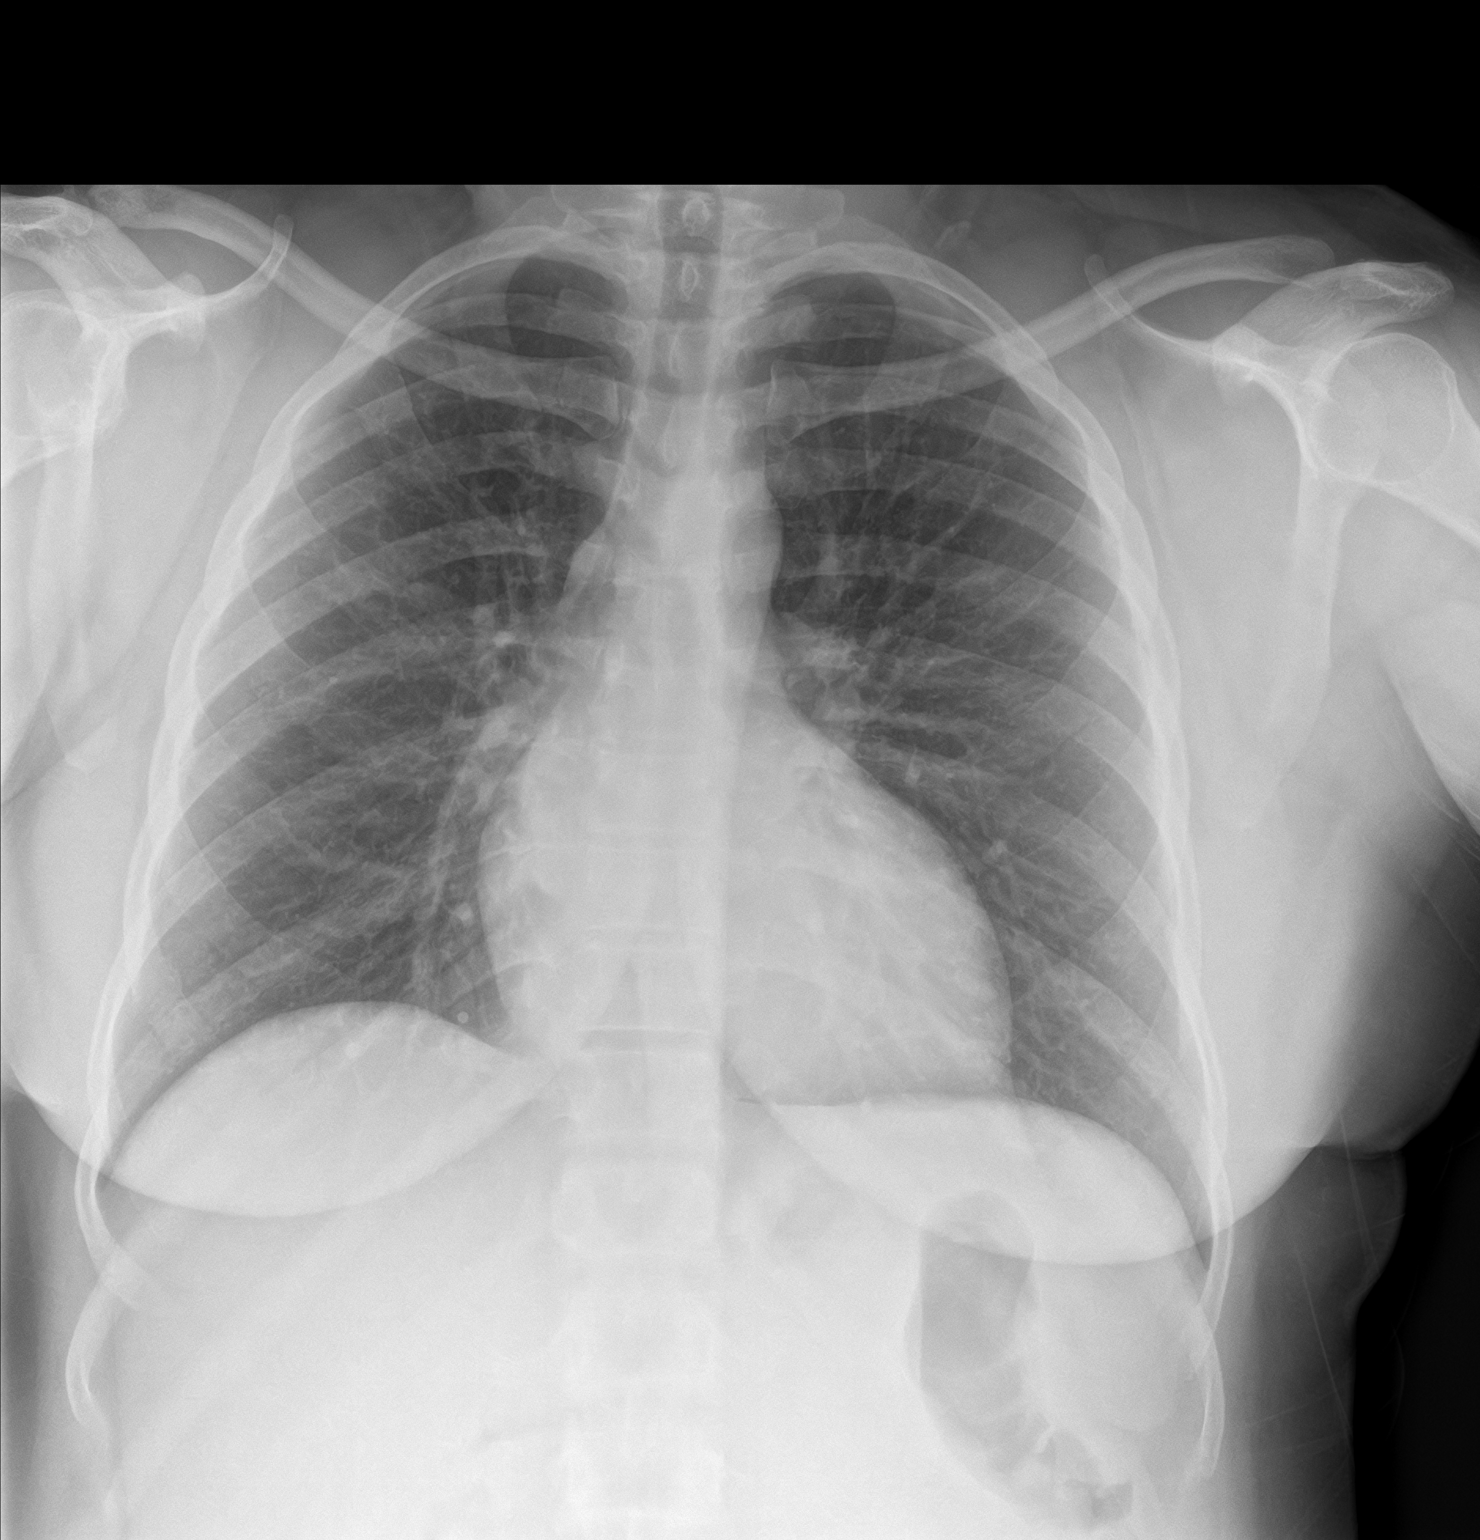
[im 2/2]
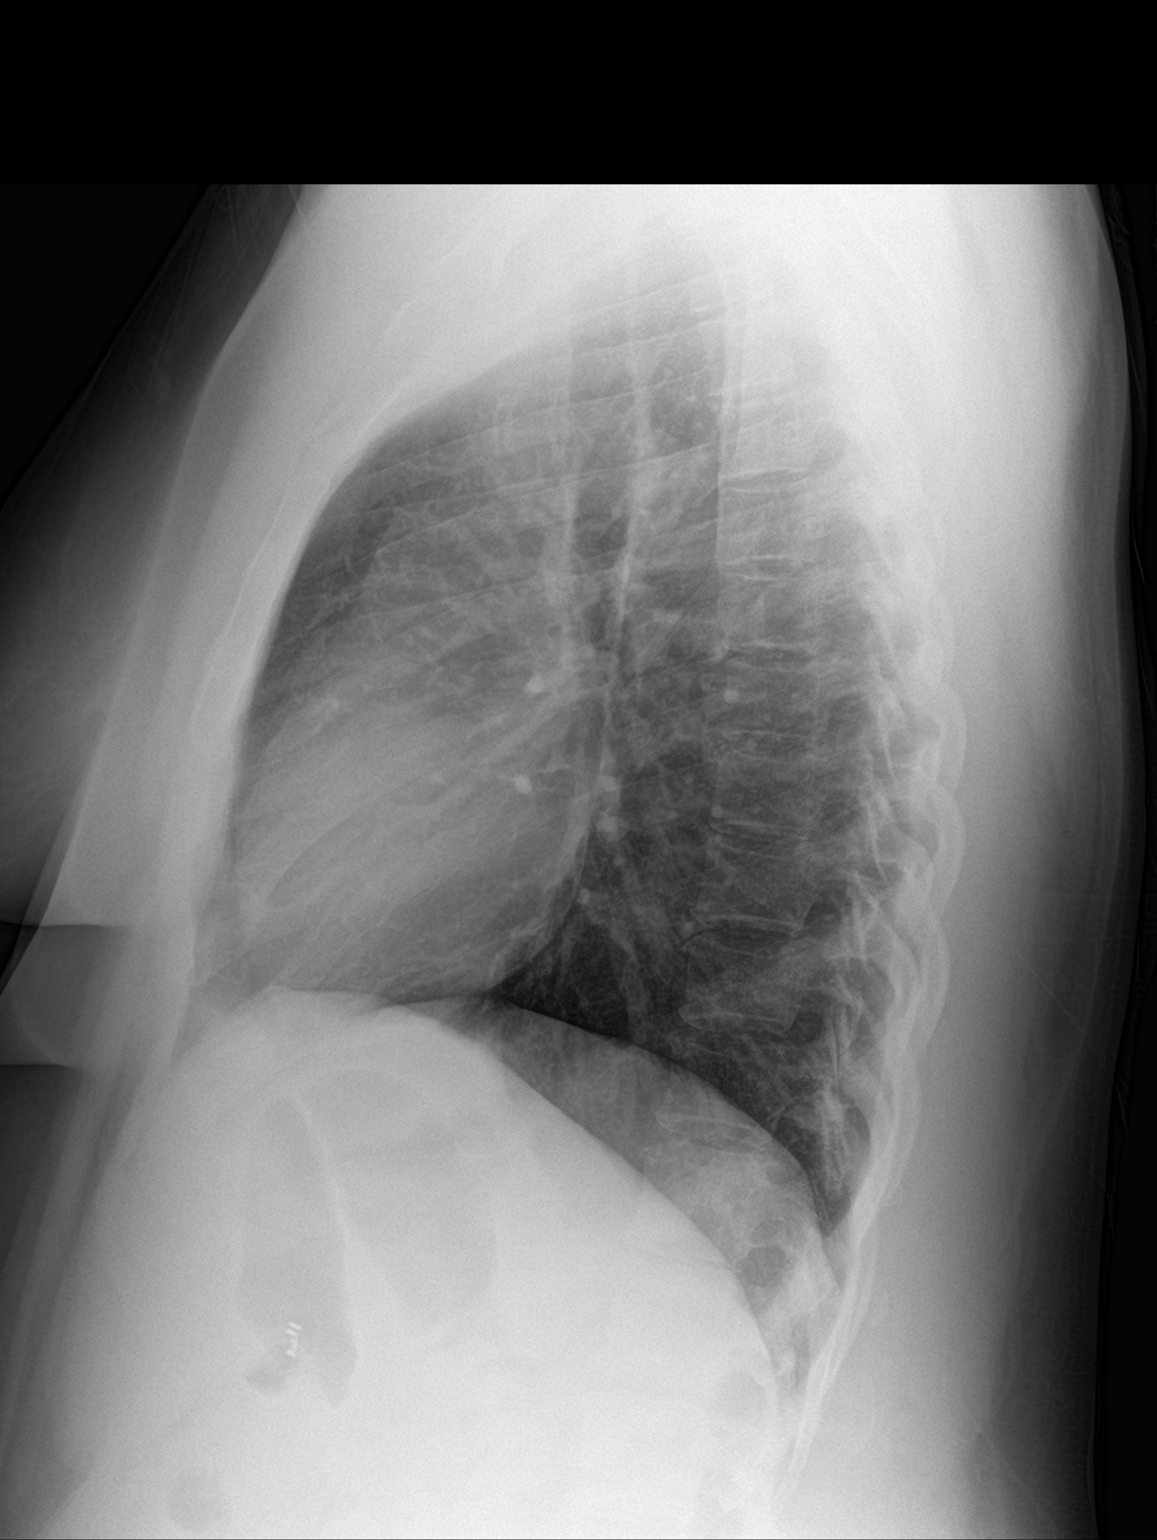

[2 of 2 positions shown; findings below may reference images not displayed]

FINDINGS: The heart size and mediastinal contours are within normal limits.
Both lungs are clear. Radiopaque surgical clips are seen within the
right upper quadrant. The visualized skeletal structures are
unremarkable.
IMPRESSION: No active cardiopulmonary disease.

## 2022-12-31 ENCOUNTER — Emergency Department: Payer: 59

## 2022-12-31 ENCOUNTER — Other Ambulatory Visit: Payer: Self-pay

## 2022-12-31 ENCOUNTER — Emergency Department
Admission: EM | Admit: 2022-12-31 | Discharge: 2022-12-31 | Disposition: A | Discharge status: Home / Self Care | Payer: 59 | Attending: Emergency Medicine | Admitting: Emergency Medicine

## 2022-12-31 DIAGNOSIS — W109XXA Fall (on) (from) unspecified stairs and steps, initial encounter: Secondary | ICD-10-CM | POA: Diagnosis not present

## 2022-12-31 DIAGNOSIS — S82832A Other fracture of upper and lower end of left fibula, initial encounter for closed fracture: Secondary | ICD-10-CM | POA: Diagnosis not present

## 2022-12-31 DIAGNOSIS — S99912A Unspecified injury of left ankle, initial encounter: Secondary | ICD-10-CM | POA: Diagnosis present

## 2022-12-31 DIAGNOSIS — S93432A Sprain of tibiofibular ligament of left ankle, initial encounter: Secondary | ICD-10-CM

## 2022-12-31 MED ORDER — ACETAMINOPHEN 325 MG PO TABS
650.0000 mg | ORAL_TABLET | Freq: Once | ORAL | Status: AC
  Administered 2022-12-31: 650 mg via ORAL
  Filled 2022-12-31: qty 2

## 2022-12-31 NOTE — Discharge Instructions (Signed)
Your x-ray shows a displaced fracture of your distal fibula as we discussed.  It is very importantly follow-up with orthopedics.  Wear the boot and use the crutches when ambulating to remain nonweightbearing.  Keep your leg elevated is much as you can.  You may apply ice to the area, 20 minutes on, 20 minutes off several times per day.  Please return for any new, worsening, or change in symptoms or other concerns. It was a pleasure caring for you today.

## 2022-12-31 NOTE — ED Provider Notes (Signed)
[facility] Provider Note    Event Date/Time   First MD Initiated Contact with Patient 12/31/22 1332     (approximate)   History   Ankle Pain   HPI  Brandi Nichols is a 47 y.o. female who presents today for evaluation of the left ankle injury.  Patient reports that she slipped on wet ground 5 days ago.  She reports that she has been able to bear only minimal weight since the incident.  She reports that her foot, ankle, and tib-fib are now swollen and her pain is worsening.  She denies paresthesias.  No open wounds.  No other injury sustained.  She has been taking ibuprofen at home with minimal relief.  There are no problems to display for this patient.         Physical Exam   Triage Vital Signs: ED Triage Vitals  Enc Vitals Group     BP      Pulse      Resp      Temp      Temp src      SpO2      Weight      Height      Head Circumference      Peak Flow      Pain Score      Pain Loc      Pain Edu?      Excl. in [place]?     Most recent vital signs: Vitals:   12/31/22 1428  BP: (!) 144/80  Pulse: (!) 53  Resp: 18  Temp: 98.1 F (36.7 C)  SpO2: 100%    Physical Exam Vitals and nursing note reviewed.  Constitutional:      General: Awake and alert. No acute distress.    Appearance: Normal appearance. The patient is normal weight.  HENT:     Head: Normocephalic and atraumatic.     Mouth: Mucous membranes are moist.  Eyes:     General: PERRL. Normal EOMs        Right eye: No discharge.        Left eye: No discharge.     Conjunctiva/sclera: Conjunctivae normal.  Cardiovascular:     Rate and Rhythm: Normal rate and regular rhythm.     Pulses: Normal pulses.  Pulmonary:     Effort: Pulmonary effort is normal. No respiratory distress.     Breath sounds: Normal breath sounds.  Abdominal:     Abdomen is soft. There is no abdominal tenderness. No rebound or guarding. No distention. Musculoskeletal:        General: No swelling.  Normal range of motion.     Cervical back: Normal range of motion and neck supple. Left lower extremity: Tenderness and swelling to the lateral and medial malleolus, no proximal fifth metacarpal tenderness.  Swelling extends to dorsum of the foot, and to distal tibia/fibula.  No proximal fibular tenderness. 2+ pedal pulses with brisk capillary refill. Intact distal sensation and strength. Able to plantar flex and dorsiflex against resistance, though pain with doing so. Negative Thompson test.  Positive squeeze test.  Sensation intact to light touch throughout.  Compartments soft and compressible throughout.  No open wounds. Skin:    General: Skin is warm and dry.     Capillary Refill: Capillary refill takes less than 2 seconds.     Findings: No rash.  Neurological:     Mental Status: The patient is awake and alert.      ED  Results / Procedures / Treatments   Labs (all labs ordered are listed, but only abnormal results are displayed) Labs Reviewed - No data to display   EKG     RADIOLOGY I independently reviewed and interpreted imaging and agree with radiologists findings.     PROCEDURES:  Critical Care performed:   Procedures   MEDICATIONS ORDERED IN ED: Medications  acetaminophen (TYLENOL) tablet 650 mg (650 mg Oral Given 12/31/22 1339)     IMPRESSION / MDM / ASSESSMENT AND PLAN / ED COURSE  I reviewed the triage vital signs and the nursing notes.   Differential diagnosis includes, but is not limited to, fracture, dislocation, contusion, sprain.  Patient is awake and alert, hemodynamically stable and neurovascularly intact.  Patient has swelling and tenderness to palpation over dorsal portion of ankle, to both the medial and lateral aspects.  She also has swelling and tenderness in her foot.  X-rays were obtained of her foot, ankle, and tib-fib in case of [place] fracture.  There is no knee pain or swelling and no ligamental laxity, do not suspect knee injury.   Negative Thompson test, do not suspect Achilles tendon rupture.  X-rays reveal acute oblique spiral fracture of the distal fibula with 3 mm of lateral cortical step-off with mild widening of the distal tibiofibular syndesmosis and widening of the medial clear space, suggestive of a deltoid ligament injury.  These findings were discussed with Dr. Joice Lofts with orthopedic surgery who recommends splint (tall cam boot okay) and crutches and close outpatient follow-up.  Patient was given the appropriate follow-up information understands the importance of this follow-up appointment.  Her compartments are soft compressible throughout, no pain out of proportion, normal distal pulses, normal sensation, not consistent with compartment syndrome.  We discussed return precautions and outpatient follow up. Patient understands and agrees with plan. Discharged in stable condition.   Patient's presentation is most consistent with acute complicated illness / injury requiring diagnostic workup.    FINAL CLINICAL IMPRESSION(S) / ED DIAGNOSES   Final diagnoses:  Displaced fracture of distal end of left fibula  Syndesmotic disruption of left ankle, initial encounter     Rx / DC Orders   ED Discharge Orders     None        Note:  This document was prepared using Dragon voice recognition software and may include unintentional dictation errors.   Keturah Shavers 12/31/22 1449    Georga Hacking, MD 12/31/22 [phone]

## 2022-12-31 NOTE — ED Triage Notes (Signed)
Per patient' report, patient fell down wooden steps 4 days ago while it was raining. Patient c/o swollen left ankle and foot. Bruising is noted on left foot.

## 2023-03-14 LAB — AMB RESULTS CONSOLE CBG: Glucose: 93

## 2023-03-15 NOTE — Progress Notes (Signed)
PCP not listed. No SDOH needs ATT.

## 2023-04-05 NOTE — Progress Notes (Signed)
Please disregard note. See notation on 05/01/2023 HEALTH EQUITY SCREENING EVENT  DATE:  SCREENING: B/P 151/91               GLUCOSE :  ONSITE PATIENT COMMENTS:  SDOH NEEDS: transportation/utility RESOURCES GIVEN: PER CHART REVIEW/PCP:  APPOINTMENTS: Chart review also indicates a future appt.  No addidtional Health Equity Team support indicated at this time.

## 2023-05-01 ENCOUNTER — Encounter: Payer: Self-pay | Admitting: Family Medicine

## 2023-05-01 ENCOUNTER — Encounter: Payer: Self-pay | Admitting: *Deleted

## 2023-05-01 NOTE — Progress Notes (Signed)
Pt attended 03/14/23 screening event where her b/p was 151/93 and her blood sugar was 93. At the event, the pt did not identify her PCP name and did identify transportation and utility insecurities. Calls to pt to f/u current needs and status, but VM not available option. Health equity team member also called PCP office, where receptionist confirmed pt saw PCP Dr. Karie Fetch at [facility] on 04/20/23, where chart review confirms pt was referred to the [facility] program of [place] for preventive health f/u. Office also confirmed pt has future PCP appt in November, 2024 with Dr. Letta Pate. [employer] [facility] ([place]) also mailed along with letter to pt for additional SDOH resources in her area as needed. No additional health equity team support indicated at this time.

## 2023-07-16 ENCOUNTER — Emergency Department
Admission: EM | Admit: 2023-07-16 | Discharge: 2023-07-16 | Disposition: A | Discharge status: Home / Self Care | Payer: 59 | Attending: Emergency Medicine | Admitting: Emergency Medicine

## 2023-07-16 ENCOUNTER — Other Ambulatory Visit: Payer: Self-pay

## 2023-07-16 ENCOUNTER — Emergency Department: Payer: 59

## 2023-07-16 ENCOUNTER — Encounter: Payer: Self-pay | Admitting: Emergency Medicine

## 2023-07-16 DIAGNOSIS — D500 Iron deficiency anemia secondary to blood loss (chronic): Secondary | ICD-10-CM | POA: Insufficient documentation

## 2023-07-16 DIAGNOSIS — D250 Submucous leiomyoma of uterus: Secondary | ICD-10-CM | POA: Insufficient documentation

## 2023-07-16 DIAGNOSIS — R1032 Left lower quadrant pain: Secondary | ICD-10-CM | POA: Diagnosis present

## 2023-07-16 DIAGNOSIS — D251 Intramural leiomyoma of uterus: Secondary | ICD-10-CM | POA: Diagnosis not present

## 2023-07-16 LAB — URINALYSIS, ROUTINE W REFLEX MICROSCOPIC
RBC / HPF: >50 RBC/hpf (ref 0–5)
Squamous Epithelial / HPF: 0 /[HPF] (ref 0–5)
WBC, UA: >50 WBC/hpf (ref 0–5)

## 2023-07-16 LAB — CBC WITH DIFFERENTIAL/PLATELET
Abs Immature Granulocytes: 0.02 10*3/uL (ref 0.00–0.07)
Basophils Absolute: 0 10*3/uL (ref 0.0–0.1)
Basophils Relative: 0 %
Eosinophils Absolute: 0.1 10*3/uL (ref 0.0–0.5)
Eosinophils Relative: 1 %
HCT: 26.3 % — ABNORMAL LOW (ref 36.0–46.0)
Hemoglobin: 7.3 g/dL — ABNORMAL LOW (ref 12.0–15.0)
Immature Granulocytes: 0 %
Lymphocytes Relative: 26 %
Lymphs Abs: 1.6 10*3/uL (ref 0.7–4.0)
MCH: 18.7 pg — ABNORMAL LOW (ref 26.0–34.0)
MCHC: 27.8 g/dL — ABNORMAL LOW (ref 30.0–36.0)
MCV: 67.4 fL — ABNORMAL LOW (ref 80.0–100.0)
Monocytes Absolute: 0.5 10*3/uL (ref 0.1–1.0)
Monocytes Relative: 8 %
Neutro Abs: 4 10*3/uL (ref 1.7–7.7)
Neutrophils Relative %: 65 %
Platelets: 273 10*3/uL (ref 150–400)
RBC: 3.9 MIL/uL (ref 3.87–5.11)
RDW: 19.9 % — ABNORMAL HIGH (ref 11.5–15.5)
Smear Review: NORMAL
WBC: 6.2 10*3/uL (ref 4.0–10.5)
nRBC: 0 % (ref 0.0–0.2)

## 2023-07-16 LAB — COMPREHENSIVE METABOLIC PANEL
ALT: 21 U/L (ref 0–44)
AST: 21 U/L (ref 15–41)
Albumin: 4 g/dL (ref 3.5–5.0)
Alkaline Phosphatase: 62 U/L (ref 38–126)
Anion gap: 9 (ref 5–15)
BUN: 8 mg/dL (ref 6–20)
CO2: 24 mmol/L (ref 22–32)
Calcium: 9.2 mg/dL (ref 8.9–10.3)
Chloride: 105 mmol/L (ref 98–111)
Creatinine, Ser: 0.67 mg/dL (ref 0.44–1.00)
GFR, Estimated: >60 mL/min (ref >60)
Glucose, Bld: 104 mg/dL — ABNORMAL HIGH (ref 70–99)
Potassium: 3.8 mmol/L (ref 3.5–5.1)
Sodium: 138 mmol/L (ref 135–145)
Total Bilirubin: 0.6 mg/dL (ref <1.2)
Total Protein: 8 g/dL (ref 6.5–8.1)

## 2023-07-16 LAB — LIPASE, BLOOD: Lipase: 36 U/L (ref 11–51)

## 2023-07-16 LAB — PREGNANCY, URINE: Preg Test, Ur: NEGATIVE

## 2023-07-16 MED ORDER — FERROUS SULFATE 325 (65 FE) MG PO TBEC: 325.0000 mg | DELAYED_RELEASE_TABLET | Freq: Two times a day (BID) | ORAL | 0 refills | Status: AC

## 2023-07-16 MED ORDER — MEDROXYPROGESTERONE ACETATE 10 MG PO TABS: 10.0000 mg | ORAL_TABLET | Freq: Every day | ORAL | 0 refills | Status: DC

## 2023-07-16 NOTE — ED Provider Notes (Signed)
Shared visit  Patient presented to the emergency department with heavy menstrual cycle.  Patient with anemia.  Similar episodes in the past.  Patient will need follow-up with OB/GYN.  Discussed iron supplements.  Given return precautions.   Corena Herter, MD 07/16/23 2057

## 2023-07-16 NOTE — ED Provider Notes (Signed)
[facility] Provider Note    Event Date/Time   First MD Initiated Contact with Patient 07/16/23 [phone]     (approximate)   History   Abdominal Pain   HPI  Brandi Nichols is a 47 y.o. female who presents today with left lower abdominal pain, and experiencing menstrual's cycle with  excessive volume.  Patient has been using 24 pads in the last 3 days.  Patient reports having diarrhea today.  Patient denies nausea, vomiting, fever.  Patient reports having Depo-Provera to decrease the bleeding .  Patient had similar episode 2 months ago after getting the Depo-Provera.  Patient thinks Depo-Provera is worsening the bleedings .  Patient had tubal ligation several years ago. No sick contacts at home.  Patient seen by PCP she does not have OB/GYN.  Patient diagnosed with anemia due to her abnormal vaginal hemorrhage     Physical Exam   Triage Vital Signs: ED Triage Vitals  Encounter Vitals Group     BP 07/16/23 0820 (!) 193/101     Systolic BP Percentile --      Diastolic BP Percentile --      Pulse Rate 07/16/23 0820 94     Resp 07/16/23 0820 16     Temp 07/16/23 0820 98.1 F (36.7 C)     Temp Source 07/16/23 0820 Oral     SpO2 07/16/23 0820 100 %     Weight 07/16/23 0823 210 lb 1.6 oz (95.3 kg)     Height --      Head Circumference --      Peak Flow --      Pain Score 07/16/23 0823 8     Pain Loc --      Pain Education --      Exclude from Growth Chart --     Most recent vital signs: Vitals:   07/16/23 1048 07/16/23 1230  BP: (!) 144/87 (!) 140/80  Pulse: 75 70  Resp: 16 16  Temp:  98 F (36.7 C)  SpO2: 100% 100%     General: Awake, no distress.  CV:  Good peripheral perfusion.  Resp:  Normal effort.  Abd:  Bowel sounds positive, no distention.  Murphy negative, Rovsing negative, McBurney negative, rebound negative.  Left lower quadrant is tender to deep palpation.  CVA bilateral negative.   ED Results / Procedures / Treatments    Labs (all labs ordered are listed, but only abnormal results are displayed) Labs Reviewed  URINALYSIS, ROUTINE W REFLEX MICROSCOPIC - Abnormal; Notable for the following components:      Result Value   Color, Urine RED (*)    APPearance CLOUDY (*)    Glucose, UA   (*)    Value: TEST NOT REPORTED DUE TO COLOR INTERFERENCE OF URINE PIGMENT   Hgb urine dipstick   (*)    Value: TEST NOT REPORTED DUE TO COLOR INTERFERENCE OF URINE PIGMENT   Bilirubin Urine   (*)    Value: TEST NOT REPORTED DUE TO COLOR INTERFERENCE OF URINE PIGMENT   Ketones, ur   (*)    Value: TEST NOT REPORTED DUE TO COLOR INTERFERENCE OF URINE PIGMENT   Protein, ur   (*)    Value: TEST NOT REPORTED DUE TO COLOR INTERFERENCE OF URINE PIGMENT   Nitrite   (*)    Value: TEST NOT REPORTED DUE TO COLOR INTERFERENCE OF URINE PIGMENT   Leukocytes,Ua   (*)    Value: TEST NOT REPORTED DUE TO  COLOR INTERFERENCE OF URINE PIGMENT   Bacteria, UA RARE (*)    All other components within normal limits  COMPREHENSIVE METABOLIC PANEL - Abnormal; Notable for the following components:   Glucose, Bld 104 (*)    All other components within normal limits  CBC WITH DIFFERENTIAL/PLATELET - Abnormal; Notable for the following components:   Hemoglobin 7.3 (*)    HCT 26.3 (*)    MCV 67.4 (*)    MCH 18.7 (*)    MCHC 27.8 (*)    RDW 19.9 (*)    All other components within normal limits  PREGNANCY, URINE  LIPASE, BLOOD     EKG     RADIOLOGY I independently reviewed and interpreted imaging and agree with radiologists findings.  Pelvic ultrasound revealed uterine fibroids.    PROCEDURES:  Critical Care performed:   Procedures   MEDICATIONS ORDERED IN ED: Medications - No data to display   IMPRESSION / MDM / ASSESSMENT AND PLAN / ED COURSE  I reviewed the triage vital signs and the nursing notes.  Differential diagnosis includes, but is not limited to,uterine leiomyoma, ovarian torsion endometriosis, Depo Provera side  effects.  Ectopic     Patient's presentation is most consistent with acute complicated illness / injury requiring diagnostic workup. Patient's diagnosis is consistent with uterine fibroid. Negative HCG per urine sample. CBC differential reports hemoglobin 7.3 confirming the diagnosis of anemia.  Lipase and CMP within normal limits, UA is not conclusive due to the presence of blood but has rare bacteria ruling out UTI. Patient will be discharged home with prescriptions for Provera, iron. Patient is to follow up with PCP, and OB/GYN as needed or otherwise directed. Patient is given ED precautions to return to the ED for any worsening or new symptoms. Discussed plan of care with patient, answered all of patient's questions, Patient agreeable to plan of care. Patient to follow  up with PCP as needed. Advised patient to take medications according to the instructions on the label. Discussed possible side effects of new medications. Patient verbalized understanding.     FINAL CLINICAL IMPRESSION(S) / ED DIAGNOSES   Final diagnoses:  Intramural and submucous leiomyoma of uterus  Iron deficiency anemia due to chronic blood loss     Rx / DC Orders   ED Discharge Orders          Ordered    medroxyPROGESTERone (PROVERA) 10 MG tablet  Daily        07/16/23 1305    ferrous sulfate 325 (65 FE) MG EC tablet  2 times daily        07/16/23 1305             Note:  This document was prepared using Dragon voice recognition software and may include unintentional dictation errors.   Gladys Damme, PA-C 07/16/23 1310    Corena Herter, MD 07/16/23 2057

## 2023-07-16 NOTE — Discharge Instructions (Addendum)
Please please make an appointment with your PCP and OB/GYN.  Take iron as advised after meals with vitamin C. Take provera daily.

## 2023-07-16 NOTE — ED Triage Notes (Addendum)
C/O hematuria x 1 day and lower abd pain.  Patient also c/o vaginal bleeding and blood clots x 1 week.  Wearing pads for bleeding.  Started passing blood clots yesterday.

## 2023-07-17 ENCOUNTER — Ambulatory Visit: Payer: 59

## 2023-07-20 ENCOUNTER — Other Ambulatory Visit: Payer: Self-pay | Admitting: Obstetrics and Gynecology

## 2023-07-20 DIAGNOSIS — D500 Iron deficiency anemia secondary to blood loss (chronic): Secondary | ICD-10-CM

## 2023-07-20 NOTE — Progress Notes (Signed)
Venofer weekly x 3 weeks

## 2023-07-24 ENCOUNTER — Ambulatory Visit
Admission: RE | Admit: 2023-07-24 | Discharge: 2023-07-24 | Disposition: A | Discharge status: Home / Self Care | Payer: 59 | Source: Ambulatory Visit | Attending: Obstetrics and Gynecology | Admitting: Obstetrics and Gynecology

## 2023-07-24 DIAGNOSIS — D500 Iron deficiency anemia secondary to blood loss (chronic): Secondary | ICD-10-CM | POA: Diagnosis present

## 2023-07-24 MED ORDER — IRON SUCROSE 300 MG IVPB - SIMPLE MED
300.0000 mg | Freq: Once | Status: AC
  Administered 2023-07-24: 300 mg via INTRAVENOUS
  Filled 2023-07-24: qty 300

## 2023-07-31 ENCOUNTER — Ambulatory Visit
Admission: RE | Admit: 2023-07-31 | Discharge: 2023-07-31 | Disposition: A | Discharge status: Home / Self Care | Payer: 59 | Source: Ambulatory Visit | Attending: Obstetrics and Gynecology | Admitting: Obstetrics and Gynecology

## 2023-07-31 DIAGNOSIS — D509 Iron deficiency anemia, unspecified: Secondary | ICD-10-CM | POA: Diagnosis present

## 2023-07-31 MED ORDER — IRON SUCROSE 300 MG IVPB - SIMPLE MED
300.0000 mg | Freq: Once | Status: AC
  Administered 2023-07-31: 300 mg via INTRAVENOUS
  Filled 2023-07-31: qty 300

## 2023-07-31 MED ORDER — IRON SUCROSE 300 MG IVPB - SIMPLE MED
300.0000 mg | Freq: Once | Status: DC
Start: 2023-07-31 — End: 2023-07-31
  Filled 2023-07-31: qty 265

## 2023-08-01 ENCOUNTER — Ambulatory Visit
Admission: RE | Admit: 2023-08-01 | Discharge: 2023-08-01 | Disposition: A | Discharge status: Home / Self Care | Payer: 59 | Source: Ambulatory Visit | Attending: Obstetrics and Gynecology | Admitting: Obstetrics and Gynecology

## 2023-08-07 ENCOUNTER — Ambulatory Visit
Admission: RE | Admit: 2023-08-07 | Discharge: 2023-08-07 | Disposition: A | Discharge status: Home / Self Care | Payer: 59 | Source: Ambulatory Visit | Attending: Obstetrics and Gynecology | Admitting: Obstetrics and Gynecology

## 2023-08-07 DIAGNOSIS — D500 Iron deficiency anemia secondary to blood loss (chronic): Secondary | ICD-10-CM | POA: Insufficient documentation

## 2023-08-07 MED ORDER — IRON SUCROSE 300 MG IVPB - SIMPLE MED
300.0000 mg | Freq: Once | Status: AC
  Administered 2023-08-07: 300 mg via INTRAVENOUS
  Filled 2023-08-07: qty 300

## 2023-08-30 ENCOUNTER — Other Ambulatory Visit: Payer: Self-pay | Admitting: Obstetrics and Gynecology

## 2023-09-11 NOTE — H&P (Signed)
Brandi Nichols is a 48 y.o. female here TVH and bilateral salpingectomy .here for continued work up for menorrhagia . U/s at [facility] in dec 2024:   Pt is s/p an  acute episode of bleeding . Seen in ED 07/16/23. Pt states she has a long h/o of menorrhagia bleeds for 15 days at a time . + clots  She has failed Mirena and depo Provera trying to control  She has undergone IV venofer x 3    G6P4 s/p svd x 4 , s/p BTL  U/s at [facility] :    FINDINGS:  Uterus   Measurements: 10.2 x 6.3 x 6.8 cm = volume: 230 mL. Retroverted.  Similar indistinct appearance of the endometrial, myometrial  junction (image 65 of series 1). Multiple small uterine fibroids  ranging from 12 mm to 23 mm diameter. Mostly these appear  myometrial, however, a 17 mm fibroid is possibly submucosal on  series 1, image 69.   Endometrium   Thickness: Indistinct, approximately 14 mm (image 65).  Heterogeneous. But no discrete Mass.   Right ovary   Measurements: 3.5 x 2.3 x 2.2 cm = volume: 9 mL. Normal  appearance/no adnexal mass.   Left ovary   Measurements: 2.7 x 1.8 x 1.6 cm = volume: 4 mL. Normal  appearance/no adnexal mass.   Pulsed Doppler evaluation of both ovaries demonstrates normal  low-resistance arterial and venous waveforms.   Other findings   No abnormal free fluid.    Embx by me  A-Endometrial Biopsy: ENDOMETRIAL FRAGMENTS WITH  EXTENSIVE BREAKDOWN CHANGES IN A BACKGROUND OF BLOOD.  NO HYPERPLASIA OR CARCINOMA IN A FRAGMENTED SPECIMEN (SEE  COMMENTS).     results : Comment: Part A-Endometrial Biopsy: ENDOMETRIAL FRAGMENTS WITH EXTENSIVE BREAKDOWN CHANGES IN A BACKGROUND OF BLOOD. NO HYPERPLASIA OR CARCINOMA IN A FRAGMENTED SPECIMEN (SEE COMMENTS).      Past Medical History:  has a past medical history of Anemia and Hypertension.  Past Surgical History:  has a past surgical history that includes Cholecystectomy and BTL. Family History: family history is not on file. Social History:  reports that  she has been smoking cigarettes. She has never used smokeless tobacco. She reports current alcohol use of about 2.0 standard drinks of alcohol per week. She reports that she does not use drugs. OB/GYN History:  OB History       Gravida  6   Para  4   Term  4   Preterm      AB  2   Living  4        SAB      IAB      Ectopic      Molar      Multiple      Live Births  4             Allergies: has No Known Allergies. Medications:  Current Medications    Current Outpatient Medications:    ferrous sulfate 325 (65 FE) MG EC tablet, Take 325 mg by mouth 2 (two) times daily, Disp: , Rfl:    lisinopriL-hydroCHLOROthiazide (ZESTORETIC) 10-12.5 mg tablet, , Disp: , Rfl:    FEROSUL 325 mg (65 mg iron) tablet, , Disp: , Rfl:    HYDROcodone-acetaminophen (NORCO) 5-325 mg tablet, Take 1 tablet by mouth every 6 (six) hours as needed (Patient not taking: Reported on 08/23/2023), Disp: 20 tablet, Rfl: 0   medroxyPROGESTERone (PROVERA) 10 MG tablet, Take 1 tablet by mouth once daily, Disp: , Rfl:  Review of Systems: General:                      No fatigue or weight loss Eyes:                           No vision changes Ears:                            No hearing difficulty Respiratory:                No cough or shortness of breath Pulmonary:                  No asthma or shortness of breath Cardiovascular:           No chest pain, palpitations, dyspnea on exertion Gastrointestinal:          No abdominal bloating, chronic diarrhea, constipations, masses, pain or hematochezia Genitourinary:             No hematuria, dysuria, abnormal vaginal discharge, pelvic pain, +Menometrorrhagia Lymphatic:                   No swollen lymph nodes Musculoskeletal:No muscle weakness Neurologic:                  No extremity weakness, syncope, seizure disorder Psychiatric:                  No history of depression, delusions or suicidal/homicidal ideation      Exam:       Vitals:     09/12/23  BP: (!) 151/82  Pulse: 83      Body mass index is 29.86 kg/m.   WDWN / black female in NAD   Lungs: CTA  CV : RRR without murmur   Breast: exam done in sitting and lying position : No dimpling or retraction, no dominant mass, no spontaneous discharge, no axillary adenopathy Neck:  no thyromegaly Abdomen: soft , no mass, normal active bowel sounds,  non-tender, no rebound tenderness Pelvic: tanner stage 5 ,  External genitalia: vulva /labia no lesions Urethra: no prolapse Vagina: normal physiologic d/c adequate room for TVH  Cervix: no lesions, no cervical motion tenderness   Uterus: normal size shape and contour, non-tender Adnexa: no mass,  non-tender   Rectovaginal:  Saline infusion sonohysterography: betadine prep to the cervix followed by placement of the HSG catheter into the endometrial canal . Sterile H2O is injected while performing a transvaginal u/s . Findings:  Uterus ======   Visualized. Size 102 mm x 85 mm x 70 mm Enlarged Position: anteverted Malformations: none Myometrium: inhomogeneous Endometrium: normal. Endometrial thickness, total 7.1 mm Cervix details: nabothian cysts visualized Fibroid(s) uterus multimyomatosus.        1.  Size 22.00 mm x 17.00 mm x 22 mm. Mean 20.3 mm. Vol 4.3 cm. FIGO - 5 (subserosal but > 50% intramural). Posterior        2.  Size 24.00 mm x 22.00 mm x 21 mm. Mean 22.3 mm. Vol 5.8 cm. FIGO - 5 (subserosal but > 50% intramural). Right lateral wall        3.  Size 26.00 mm x 17.00 mm x 27 mm. Mean 23.3 mm. Vol 6.2 cm. FIGO - 5 (subserosal but > 50% intramural). Fundal        4.  Size 18.00 mm x  14.00 mm x 16 mm. Mean 16.0 mm. Vol 2.1 cm. FIGO - 3 (contact with endometrium but 100% intramural) Polyp(s)   1.  Size 11 mm x 5 mm x 6 mm. Mean 7.3 mm        2.  Size 8 mm x 3 mm x 8 mm. Mean 6.3 mm   Right Ovary =========   Visualized, slightly prominent. Size 39 mm x 22 mm x 23 mm Cyst(s)    Size 22.0 mm x 12.0 mm x  14 mm. Mean 16.0 mm. Vol 1.935 cm. Simple cyst   Left Ovary ========   Visualized, Normal. Size 36 mm x 15 mm x 21 mm   Cul de Sac =========   Visualized. No free fluid visualized     Impression:  The primary encounter diagnosis was Menorrhagia with irregular cycle. A diagnosis of Iron deficiency anemia due to chronic blood loss was also pertinent to this visit.       Plan:    Spoke to her about options of polyp resection , ablation or definitive TVH and bilateral salpingectomy . SHe opts for latter . Repeat CBc + ferritin  Schedule for surgery .  Benefits and risks to surgery: The proposed benefit of the surgery has been discussed with the patient. The possible risks include, but are not limited to: organ injury to the bowel , bladder, ureters, and major blood vessels and nerves. There is a possibility of additional surgeries resulting from these injuries. There is also the risk of blood transfusion and the need to receive blood products during or after the procedure which may rarely lead to HIV or Hepatitis C infection. There is a risk of developing a deep venous thrombosis or a pulmonary embolism . There is the possibility of wound infection and also anesthetic complications, even the rare possibility of death. The patient understands these risks and wishes to proceed. All questions have been answered      No follow-ups on file.   Vilma Prader, MD

## 2023-09-19 ENCOUNTER — Encounter
Admission: RE | Admit: 2023-09-19 | Discharge: 2023-09-19 | Disposition: A | Discharge status: Home / Self Care | Payer: 59 | Source: Ambulatory Visit | Attending: Obstetrics and Gynecology | Admitting: Obstetrics and Gynecology

## 2023-09-19 ENCOUNTER — Other Ambulatory Visit: Payer: Self-pay

## 2023-09-19 DIAGNOSIS — Z01812 Encounter for preprocedural laboratory examination: Secondary | ICD-10-CM

## 2023-09-19 DIAGNOSIS — I10 Essential (primary) hypertension: Secondary | ICD-10-CM

## 2023-09-19 HISTORY — DX: Iron deficiency anemia secondary to blood loss (chronic): D50.0

## 2023-09-19 HISTORY — DX: Gastro-esophageal reflux disease without esophagitis: K21.9

## 2023-09-19 HISTORY — DX: Intramural leiomyoma of uterus: D25.1

## 2023-09-19 HISTORY — DX: Excessive and frequent menstruation with irregular cycle: N92.1

## 2023-09-19 NOTE — Patient Instructions (Signed)
Your procedure is scheduled on: 09/28/23 - Thursday Report to the Registration Desk on the 1st floor of the [facility]. To find out your arrival time, please call [phone] between 1PM - 3PM on: 09/27/23 - Wednesday If your arrival time is 6:00 am, do not arrive before that time as the [facility] entrance doors do not open until 6:00 am.  REMEMBER: Instructions that are not followed completely may result in serious medical risk, up to and including death; or upon the discretion of your surgeon and anesthesiologist your surgery may need to be rescheduled.  Do not eat food after midnight the night before surgery.  No gum chewing or hard candies.  You may however, drink CLEAR liquids up to 2 hours before you are scheduled to arrive for your surgery. Do not drink anything within 2 hours of your scheduled arrival time.  Clear liquids include: - water  - apple juice without pulp - gatorade (not RED colors) - black coffee or tea (Do NOT add milk or creamers to the coffee or tea) Do NOT drink anything that is not on this list.  In addition, your doctor has ordered for you to drink the provided:  Ensure Pre-Surgery Clear Carbohydrate Drink  Drinking this carbohydrate drink up to two hours before surgery helps to reduce insulin resistance and improve patient outcomes. Please complete drinking 2 hours before scheduled arrival time.  One week prior to surgery: Stop Anti-inflammatories (NSAIDS) such as Advil, Aleve, Ibuprofen, Motrin, Naproxen, Naprosyn and Aspirin based products such as Excedrin, Goody's Powder, BC Powder. You may take Tylenol if needed for pain up until the day of surgery  Stop ANY OVER THE COUNTER supplements until after surgery.  .ON THE DAY OF SURGERY ONLY TAKE THESE MEDICATIONS WITH SIPS OF WATER:  none   No Alcohol for 24 hours before or after surgery.  No Smoking including e-cigarettes for 24 hours before surgery.  No chewable tobacco products for at least 6  hours before surgery.  No nicotine patches on the day of surgery.  Do not use any "recreational" drugs for at least a week (preferably 2 weeks) before your surgery.  Please be advised that the combination of cocaine and anesthesia may have negative outcomes, up to and including death. If you test positive for cocaine, your surgery will be cancelled.  On the morning of surgery brush your teeth with toothpaste and water, you may rinse your mouth with mouthwash if you wish. Do not swallow any toothpaste or mouthwash.  Use CHG Soap or wipes as directed on instruction sheet.  Do not wear jewelry, make-up, hairpins, clips or nail polish.  For welded (permanent) jewelry: bracelets, anklets, waist bands, etc.  Please have this removed prior to surgery.  If it is not removed, there is a chance that hospital personnel will need to cut it off on the day of surgery.  Do not wear lotions, powders, or perfumes.   Do not shave body hair from the neck down 48 hours before surgery.  Contact lenses, hearing aids and dentures may not be worn into surgery.  Do not bring valuables to the hospital. [facility] is not responsible for any missing/lost belongings or valuables.   Notify your doctor if there is any change in your medical condition (cold, fever, infection).  Wear comfortable clothing (specific to your surgery type) to the hospital.  After surgery, you can help prevent lung complications by doing breathing exercises.  Take deep breaths and cough every 1-2 hours.  Your doctor may order a device called an Incentive Spirometer to help you take deep breaths. When coughing or sneezing, hold a pillow firmly against your incision with both hands. This is called "splinting." Doing this helps protect your incision. It also decreases belly discomfort.  If you are being admitted to the hospital overnight, leave your suitcase in the car. After surgery it may be brought to your room.  In case of increased  patient census, it may be necessary for you, the patient, to continue your postoperative care in the Same Day Surgery department.  If you are being discharged the day of surgery, you will not be allowed to drive home. You will need a responsible individual to drive you home and stay with you for 24 hours after surgery.   If you are taking public transportation, you will need to have a responsible individual with you.  Please call the Pre-admissions Testing Dept. at [phone] if you have any questions about these instructions.  Surgery Visitation Policy:  Patients having surgery or a procedure may have two visitors.  Children under the age of 5 must have an adult with them who is not the patient.  Temporary Visitor Restrictions Due to increasing cases of flu, RSV and COVID-19: Children ages 38 and under will not be able to visit patients in [facility] hospitals under most circumstances.  Inpatient Visitation:    Visiting hours are 7 a.m. to 8 p.m. Up to four visitors are allowed at one time in a patient room. The visitors may rotate out with other people during the day.  One visitor age 45 or older may stay with the patient overnight and must be in the room by 8 p.m.    Preparing for Surgery with CHLORHEXIDINE GLUCONATE (CHG) Soap  Chlorhexidine Gluconate (CHG) Soap  o An antiseptic cleaner that kills germs and bonds with the skin to continue killing germs even after washing  o Used for showering the night before surgery and morning of surgery  Before surgery, you can play an important role by reducing the number of germs on your skin.  CHG (Chlorhexidine gluconate) soap is an antiseptic cleanser which kills germs and bonds with the skin to continue killing germs even after washing.  Please do not use if you have an allergy to CHG or antibacterial soaps. If your skin becomes reddened/irritated stop using the CHG.  1. Shower the NIGHT BEFORE SURGERY and the MORNING OF  SURGERY with CHG soap.  2. If you choose to wash your hair, wash your hair first as usual with your normal shampoo.  3. After shampooing, rinse your hair and body thoroughly to remove the shampoo.  4. Use CHG as you would any other liquid soap. You can apply CHG directly to the skin and wash gently with a scrungie or a clean washcloth.  5. Apply the CHG soap to your body only from the neck down. Do not use on open wounds or open sores. Avoid contact with your eyes, ears, mouth, and genitals (private parts). Wash face and genitals (private parts) with your normal soap.  6. Wash thoroughly, paying special attention to the area where your surgery will be performed.  7. Thoroughly rinse your body with warm water.  8. Do not shower/wash with your normal soap after using and rinsing off the CHG soap.  9. Pat yourself dry with a clean towel.  10. Wear clean pajamas to bed the night before surgery.  12. Place clean sheets on  your bed the night of your first shower and do not sleep with pets.  13. Shower again with the CHG soap on the day of surgery prior to arriving at the hospital.  14. Do not apply any deodorants/lotions/powders.  15. Please wear clean clothes to the hospital.

## 2023-09-19 NOTE — Pre-Procedure Instructions (Signed)
Patient reports swelling and itching to her face after taking gabapentin, I advised her to stop taking it and notify prescribing MD, Dr. Feliberto Gottron made aware in secure chat and med added to allergy list.

## 2023-09-20 ENCOUNTER — Encounter
Admission: RE | Admit: 2023-09-20 | Discharge: 2023-09-20 | Disposition: A | Discharge status: Home / Self Care | Payer: 59 | Source: Ambulatory Visit | Attending: Obstetrics and Gynecology | Admitting: Obstetrics and Gynecology

## 2023-09-20 DIAGNOSIS — I10 Essential (primary) hypertension: Secondary | ICD-10-CM | POA: Diagnosis not present

## 2023-09-20 DIAGNOSIS — Z01818 Encounter for other preprocedural examination: Secondary | ICD-10-CM | POA: Diagnosis present

## 2023-09-20 LAB — TYPE AND SCREEN
ABO/RH(D): O POS
Antibody Screen: NEGATIVE

## 2023-09-20 LAB — CBC
HCT: 41.9 % (ref 36.0–46.0)
Hemoglobin: 13.1 g/dL (ref 12.0–15.0)
MCH: 26.4 pg (ref 26.0–34.0)
MCHC: 31.3 g/dL (ref 30.0–36.0)
MCV: 84.5 fL (ref 80.0–100.0)
Platelets: 229 10*3/uL (ref 150–400)
RBC: 4.96 MIL/uL (ref 3.87–5.11)
RDW: 22.4 % — ABNORMAL HIGH (ref 11.5–15.5)
WBC: 7.3 10*3/uL (ref 4.0–10.5)
nRBC: 0 % (ref 0.0–0.2)

## 2023-09-20 LAB — BASIC METABOLIC PANEL
Anion gap: 9 (ref 5–15)
BUN: 14 mg/dL (ref 6–20)
CO2: 24 mmol/L (ref 22–32)
Calcium: 9.4 mg/dL (ref 8.9–10.3)
Chloride: 103 mmol/L (ref 98–111)
Creatinine, Ser: 0.68 mg/dL (ref 0.44–1.00)
GFR, Estimated: >60 mL/min (ref >60)
Glucose, Bld: 82 mg/dL (ref 70–99)
Potassium: 3.4 mmol/L — ABNORMAL LOW (ref 3.5–5.1)
Sodium: 136 mmol/L (ref 135–145)

## 2023-09-28 ENCOUNTER — Encounter
Admission: RE | Disposition: A | Discharge status: Home / Self Care | Payer: Self-pay | Source: Home / Self Care | Attending: Obstetrics and Gynecology

## 2023-09-28 ENCOUNTER — Other Ambulatory Visit: Payer: Self-pay

## 2023-09-28 ENCOUNTER — Encounter: Payer: Self-pay | Admitting: Obstetrics and Gynecology

## 2023-09-28 ENCOUNTER — Ambulatory Visit
Admission: RE | Admit: 2023-09-28 | Discharge: 2023-09-28 | Disposition: A | Discharge status: Home / Self Care | Payer: 59 | Attending: Obstetrics and Gynecology | Admitting: Obstetrics and Gynecology

## 2023-09-28 ENCOUNTER — Ambulatory Visit: Payer: 59 | Admitting: Urgent Care

## 2023-09-28 ENCOUNTER — Ambulatory Visit: Payer: 59 | Admitting: Anesthesiology

## 2023-09-28 DIAGNOSIS — I10 Essential (primary) hypertension: Secondary | ICD-10-CM | POA: Diagnosis not present

## 2023-09-28 DIAGNOSIS — D500 Iron deficiency anemia secondary to blood loss (chronic): Secondary | ICD-10-CM | POA: Diagnosis not present

## 2023-09-28 DIAGNOSIS — F17210 Nicotine dependence, cigarettes, uncomplicated: Secondary | ICD-10-CM | POA: Insufficient documentation

## 2023-09-28 DIAGNOSIS — N921 Excessive and frequent menstruation with irregular cycle: Secondary | ICD-10-CM | POA: Insufficient documentation

## 2023-09-28 DIAGNOSIS — Z01818 Encounter for other preprocedural examination: Secondary | ICD-10-CM

## 2023-09-28 DIAGNOSIS — K219 Gastro-esophageal reflux disease without esophagitis: Secondary | ICD-10-CM | POA: Insufficient documentation

## 2023-09-28 DIAGNOSIS — K660 Peritoneal adhesions (postprocedural) (postinfection): Secondary | ICD-10-CM | POA: Diagnosis not present

## 2023-09-28 DIAGNOSIS — D251 Intramural leiomyoma of uterus: Secondary | ICD-10-CM | POA: Diagnosis not present

## 2023-09-28 DIAGNOSIS — N7011 Chronic salpingitis: Secondary | ICD-10-CM | POA: Diagnosis not present

## 2023-09-28 DIAGNOSIS — Z01812 Encounter for preprocedural laboratory examination: Secondary | ICD-10-CM

## 2023-09-28 HISTORY — PX: VAGINAL HYSTERECTOMY: SHX2639

## 2023-09-28 LAB — POCT PREGNANCY, URINE: Preg Test, Ur: NEGATIVE

## 2023-09-28 LAB — ABO/RH: ABO/RH(D): O POS

## 2023-09-28 SURGERY — HYSTERECTOMY, VAGINAL
Anesthesia: General | Site: Vagina | Laterality: Bilateral

## 2023-09-28 MED ORDER — CHLORHEXIDINE GLUCONATE 0.12 % MT SOLN
15.0000 mL | Freq: Once | OROMUCOSAL | Status: AC
  Administered 2023-09-28: 15 mL via OROMUCOSAL

## 2023-09-28 MED ORDER — HYDROMORPHONE HCL 1 MG/ML IJ SOLN
INTRAMUSCULAR | Status: AC
  Filled 2023-09-28: qty 1

## 2023-09-28 MED ORDER — FENTANYL CITRATE (PF) 100 MCG/2ML IJ SOLN
25.0000 ug | INTRAMUSCULAR | Status: DC | PRN
  Administered 2023-09-28 (×3): 50 ug via INTRAVENOUS

## 2023-09-28 MED ORDER — ONDANSETRON HCL 4 MG PO TABS: 4.0000 mg | ORAL_TABLET | Freq: Three times a day (TID) | ORAL | Status: DC | PRN

## 2023-09-28 MED ORDER — POVIDONE-IODINE 10 % EX SWAB
2.0000 | Freq: Once | CUTANEOUS | Status: AC
  Administered 2023-09-28: 2 via TOPICAL

## 2023-09-28 MED ORDER — MIDAZOLAM HCL 2 MG/2ML IJ SOLN
INTRAMUSCULAR | Status: DC | PRN
  Administered 2023-09-28: 2 mg via INTRAVENOUS

## 2023-09-28 MED ORDER — ACETAMINOPHEN 500 MG PO TABS
ORAL_TABLET | ORAL | Status: AC
  Filled 2023-09-28: qty 2

## 2023-09-28 MED ORDER — SUGAMMADEX SODIUM 200 MG/2ML IV SOLN
INTRAVENOUS | Status: DC | PRN
  Administered 2023-09-28: 200 mg via INTRAVENOUS

## 2023-09-28 MED ORDER — FENTANYL CITRATE (PF) 100 MCG/2ML IJ SOLN
INTRAMUSCULAR | Status: AC
  Filled 2023-09-28: qty 2

## 2023-09-28 MED ORDER — LIDOCAINE HCL (PF) 2 % IJ SOLN
INTRAMUSCULAR | Status: AC
  Filled 2023-09-28: qty 10

## 2023-09-28 MED ORDER — ONDANSETRON HCL 4 MG/2ML IJ SOLN
INTRAMUSCULAR | Status: DC | PRN
  Administered 2023-09-28: 4 mg via INTRAVENOUS

## 2023-09-28 MED ORDER — LIDOCAINE-EPINEPHRINE 1 %-1:100000 IJ SOLN
INTRAMUSCULAR | Status: DC | PRN
  Administered 2023-09-28: 10 mL

## 2023-09-28 MED ORDER — ORAL CARE MOUTH RINSE: 15.0000 mL | Freq: Once | OROMUCOSAL | Status: AC

## 2023-09-28 MED ORDER — MIDAZOLAM HCL 2 MG/2ML IJ SOLN
INTRAMUSCULAR | Status: AC
  Filled 2023-09-28: qty 2

## 2023-09-28 MED ORDER — CHLORHEXIDINE GLUCONATE 0.12 % MT SOLN
OROMUCOSAL | Status: AC
  Filled 2023-09-28: qty 15

## 2023-09-28 MED ORDER — FENTANYL CITRATE (PF) 100 MCG/2ML IJ SOLN
INTRAMUSCULAR | Status: AC
Start: 2023-09-28 — End: ?
  Filled 2023-09-28: qty 2

## 2023-09-28 MED ORDER — OXYCODONE HCL 5 MG/5ML PO SOLN: 5.0000 mg | Freq: Once | ORAL | Status: DC | PRN

## 2023-09-28 MED ORDER — LACTATED RINGERS IV SOLN: INTRAVENOUS | Status: DC

## 2023-09-28 MED ORDER — OXYCODONE HCL 5 MG PO TABS
10.0000 mg | ORAL_TABLET | ORAL | Status: DC | PRN
  Administered 2023-09-28: 10 mg via ORAL

## 2023-09-28 MED ORDER — KETOROLAC TROMETHAMINE 30 MG/ML IJ SOLN
INTRAMUSCULAR | Status: DC | PRN
  Administered 2023-09-28: 30 mg via INTRAVENOUS

## 2023-09-28 MED ORDER — ROCURONIUM BROMIDE 10 MG/ML (PF) SYRINGE
PREFILLED_SYRINGE | INTRAVENOUS | Status: AC
  Filled 2023-09-28: qty 10

## 2023-09-28 MED ORDER — 0.9 % SODIUM CHLORIDE (POUR BTL) OPTIME
TOPICAL | Status: DC | PRN
  Administered 2023-09-28: 120 mL

## 2023-09-28 MED ORDER — LIDOCAINE HCL (CARDIAC) PF 100 MG/5ML IV SOSY
PREFILLED_SYRINGE | INTRAVENOUS | Status: DC | PRN
  Administered 2023-09-28: 80 mg via INTRAVENOUS

## 2023-09-28 MED ORDER — CEFAZOLIN SODIUM-DEXTROSE 2-4 GM/100ML-% IV SOLN
2.0000 g | Freq: Once | INTRAVENOUS | Status: AC
  Administered 2023-09-28: 2 g via INTRAVENOUS

## 2023-09-28 MED ORDER — ONDANSETRON HCL 4 MG/2ML IJ SOLN: 4.0000 mg | Freq: Once | INTRAMUSCULAR | Status: DC | PRN

## 2023-09-28 MED ORDER — DEXMEDETOMIDINE HCL IN NACL 80 MCG/20ML IV SOLN
INTRAVENOUS | Status: AC
  Filled 2023-09-28: qty 20

## 2023-09-28 MED ORDER — PHENYLEPHRINE 80 MCG/ML (10ML) SYRINGE FOR IV PUSH (FOR BLOOD PRESSURE SUPPORT)
PREFILLED_SYRINGE | INTRAVENOUS | Status: AC
  Filled 2023-09-28: qty 10

## 2023-09-28 MED ORDER — HYDROMORPHONE HCL 1 MG/ML IJ SOLN
0.2500 mg | INTRAMUSCULAR | Status: DC | PRN
  Administered 2023-09-28 (×2): 0.5 mg via INTRAVENOUS

## 2023-09-28 MED ORDER — ROCURONIUM BROMIDE 100 MG/10ML IV SOLN
INTRAVENOUS | Status: DC | PRN
  Administered 2023-09-28: 50 mg via INTRAVENOUS
  Administered 2023-09-28 (×3): 10 mg via INTRAVENOUS

## 2023-09-28 MED ORDER — ACETAMINOPHEN 10 MG/ML IV SOLN: 1000.0000 mg | Freq: Once | INTRAVENOUS | Status: DC | PRN

## 2023-09-28 MED ORDER — EPHEDRINE 5 MG/ML INJ
INTRAVENOUS | Status: AC
  Filled 2023-09-28: qty 5

## 2023-09-28 MED ORDER — PROPOFOL 10 MG/ML IV BOLUS
INTRAVENOUS | Status: DC | PRN
  Administered 2023-09-28: 180 mg via INTRAVENOUS

## 2023-09-28 MED ORDER — DEXAMETHASONE SODIUM PHOSPHATE 10 MG/ML IJ SOLN
INTRAMUSCULAR | Status: DC | PRN
  Administered 2023-09-28: 10 mg via INTRAVENOUS

## 2023-09-28 MED ORDER — DEXMEDETOMIDINE HCL IN NACL 80 MCG/20ML IV SOLN
INTRAVENOUS | Status: DC | PRN
  Administered 2023-09-28 (×2): 4 ug via INTRAVENOUS
  Administered 2023-09-28: 8 ug via INTRAVENOUS

## 2023-09-28 MED ORDER — CEFAZOLIN SODIUM-DEXTROSE 2-4 GM/100ML-% IV SOLN
INTRAVENOUS | Status: AC
  Filled 2023-09-28: qty 100

## 2023-09-28 MED ORDER — LIDOCAINE-EPINEPHRINE 1 %-1:100000 IJ SOLN
INTRAMUSCULAR | Status: AC
  Filled 2023-09-28: qty 1

## 2023-09-28 MED ORDER — ACETAMINOPHEN 500 MG PO TABS
1000.0000 mg | ORAL_TABLET | ORAL | Status: AC
Start: 2023-09-28 — End: 2023-09-28
  Administered 2023-09-28: 1000 mg via ORAL

## 2023-09-28 MED ORDER — ONDANSETRON HCL 4 MG/2ML IJ SOLN
INTRAMUSCULAR | Status: AC
  Filled 2023-09-28: qty 2

## 2023-09-28 MED ORDER — OXYCODONE HCL 5 MG PO TABS
ORAL_TABLET | ORAL | Status: AC
  Filled 2023-09-28: qty 2

## 2023-09-28 MED ORDER — FENTANYL CITRATE (PF) 100 MCG/2ML IJ SOLN
INTRAMUSCULAR | Status: DC | PRN
  Administered 2023-09-28 (×4): 50 ug via INTRAVENOUS

## 2023-09-28 MED ORDER — DEXAMETHASONE SODIUM PHOSPHATE 10 MG/ML IJ SOLN
INTRAMUSCULAR | Status: AC
  Filled 2023-09-28: qty 2

## 2023-09-28 MED ORDER — SODIUM CHLORIDE (PF) 0.9 % IJ SOLN
INTRAMUSCULAR | Status: AC
  Filled 2023-09-28: qty 20

## 2023-09-28 MED ORDER — OXYCODONE HCL 5 MG PO TABS: 5.0000 mg | ORAL_TABLET | Freq: Once | ORAL | Status: DC | PRN

## 2023-09-28 SURGICAL SUPPLY — 32 items
BAG URINE DRAIN 2000ML AR STRL (UROLOGICAL SUPPLIES) IMPLANT
CATH ROBINSON RED A/P 16FR (CATHETERS) ×1 IMPLANT
CATH URTH 16FR FL 2W BLN LF (CATHETERS) IMPLANT
COVER LIGHT HANDLE STERIS (MISCELLANEOUS) IMPLANT
DRAPE PERI LITHO V/GYN (MISCELLANEOUS) ×1 IMPLANT
DRAPE SURG 17X11 SM STRL (DRAPES) ×1 IMPLANT
DRAPE UNDER BUTTOCK W/FLU (DRAPES) ×1 IMPLANT
ELECT REM PT RETURN 9FT ADLT (ELECTROSURGICAL) ×1
ELECTRODE REM PT RTRN 9FT ADLT (ELECTROSURGICAL) ×1 IMPLANT
GAUZE 4X4 16PLY ~~LOC~~+RFID DBL (SPONGE) ×2 IMPLANT
GLOVE SURG SYN 8.0 (GLOVE) ×1
GLOVE SURG SYN 8.0 PF PI (GLOVE) ×1 IMPLANT
GOWN STRL REUS W/ TWL LRG LVL3 (GOWN DISPOSABLE) ×3 IMPLANT
GOWN STRL REUS W/ TWL XL LVL3 (GOWN DISPOSABLE) ×1 IMPLANT
KIT TURNOVER CYSTO (KITS) ×1 IMPLANT
LABEL OR SOLS (LABEL) ×1 IMPLANT
MANIFOLD NEPTUNE II (INSTRUMENTS) ×1 IMPLANT
NDL HYPO 22X1.5 SAFETY MO (MISCELLANEOUS) ×1 IMPLANT
NEEDLE HYPO 22X1.5 SAFETY MO (MISCELLANEOUS) ×1
PACK BASIN MINOR ARMC (MISCELLANEOUS) ×1 IMPLANT
PAD OB MATERNITY 11 LF (PERSONAL CARE ITEMS) ×1 IMPLANT
PAD PREP OB/GYN DISP 24X41 (PERSONAL CARE ITEMS) ×1 IMPLANT
SOL PREP PVP 2OZ (MISCELLANEOUS) ×2
SOLUTION PREP PVP 2OZ (MISCELLANEOUS) ×2 IMPLANT
SUT PDS 2-0 27IN (SUTURE) IMPLANT
SUT VIC AB 0 CT1 27XCR 8 STRN (SUTURE) ×2 IMPLANT
SUT VIC AB 0 CT1 36 (SUTURE) ×1 IMPLANT
SUT VIC AB 2-0 SH 27XBRD (SUTURE) ×1 IMPLANT
SYR 10ML LL (SYRINGE) ×1 IMPLANT
SYR CONTROL 10ML LL (SYRINGE) ×1 IMPLANT
TAPE TRANSPORE STRL 2 31045 (GAUZE/BANDAGES/DRESSINGS) IMPLANT
TRAP FLUID SMOKE EVACUATOR (MISCELLANEOUS) ×1 IMPLANT

## 2023-09-28 NOTE — Brief Op Note (Signed)
09/28/2023  1:30 PM  PATIENT:  Brandi Nichols  48 y.o. female  PRE-OPERATIVE DIAGNOSIS:  menorrhagia, anemia  POST-OPERATIVE DIAGNOSIS:  menorrhagia, anemia Right hydrosalpinx  PROCEDURE:  Procedure(s): HYSTERECTOMY VAGINAL, BILATERAL SALPINGECTOMY (Bilateral)  SURGEON:  Surgeons and Role:    * Markez Dowland, Ihor Austin, MD - Primary    * Maki Hege, Festus Holts, MD - Assisting  PHYSICIAN ASSISTANT: CST  ASSISTANTS: none   ANESTHESIA:   general  EBL:  125 mL  IOF 700cc , uo 150 cc   BLOOD ADMINISTERED:none  DRAINS: none   LOCAL MEDICATIONS USED:  LIDOCAINE   SPECIMEN:  Source of Specimen:  cx , uterus , bilateral fallopian tubes ( portion ) and right hydrosalpinx   DISPOSITION OF SPECIMEN:  PATHOLOGY  COUNTS:  YES  TOURNIQUET:  * No tourniquets in log *  DICTATION: .Other Dictation: Dictation Number verbal  PLAN OF CARE: Discharge to home after PACU  PATIENT DISPOSITION:  PACU - hemodynamically stable.   Delay start of Pharmacological VTE agent (>24hrs) due to surgical blood loss or risk of bleeding: not applicable

## 2023-09-28 NOTE — Anesthesia Preprocedure Evaluation (Addendum)
Anesthesia Evaluation  Patient identified by MRN, date of birth, ID band Patient awake    Reviewed: Allergy & Precautions, NPO status , Patient's Chart, lab work & pertinent test results  History of Anesthesia Complications Negative for: history of anesthetic complications  Airway Mallampati: III  TM Distance: >3 FB Neck ROM: full    Dental  (+) Chipped, Poor Dentition, Missing   Pulmonary neg shortness of breath, Current Smoker and Patient abstained from smoking.   Pulmonary exam normal        Cardiovascular Exercise Tolerance: Good hypertension, (-) angina (-) Past MI and (-) DOE Normal cardiovascular exam  ECG 09/20/23:  Normal sinus rhythm Minimal voltage criteria for LVH, may be normal variant ( Sokolow-Lyon ) Borderline ECG When compared with ECG of 20-Feb-2008 13:39, No significant change was found   Neuro/Psych negative neurological ROS  negative psych ROS   GI/Hepatic Neg liver ROS,GERD  Controlled,,  Endo/Other  negative endocrine ROS    Renal/GU      Musculoskeletal   Abdominal   Peds  Hematology  (+) Blood dyscrasia, anemia   Anesthesia Other Findings Past Medical History: No date: GERD (gastroesophageal reflux disease) No date: Hypertension No date: Intramural leiomyoma of uterus No date: Iron deficiency anemia due to chronic blood loss No date: Menorrhagia with irregular cycle  Past Surgical History: No date: CHOLECYSTECTOMY No date: WISDOM TOOTH EXTRACTION  BMI    Body Mass Index: 30.51 kg/m      Reproductive/Obstetrics negative OB ROS                             Anesthesia Physical Anesthesia Plan  ASA: 2  Anesthesia Plan: General   Post-op Pain Management:    Induction: Intravenous  PONV Risk Score and Plan: Ondansetron, Dexamethasone, Midazolam and Treatment may vary due to age or medical condition  Airway Management Planned: Oral ETT  Additional  Equipment:   Intra-op Plan:   Post-operative Plan: Extubation in OR  Informed Consent: I have reviewed the patients History and Physical, chart, labs and discussed the procedure including the risks, benefits and alternatives for the proposed anesthesia with the patient or authorized representative who has indicated his/her understanding and acceptance.     Dental Advisory Given  Plan Discussed with: Anesthesiologist, CRNA and Surgeon  Anesthesia Plan Comments: (Patient consented for risks of anesthesia including but not limited to:  - adverse reactions to medications - damage to eyes, teeth, lips or other oral mucosa - nerve damage due to positioning  - sore throat or hoarseness - Damage to heart, brain, nerves, lungs, other parts of body or loss of life  Patient voiced understanding and assent.)        Anesthesia Quick Evaluation

## 2023-09-28 NOTE — Progress Notes (Signed)
Ready for [facility] , BS Labs reviewed ,negative HCG  NPO . Proceed with sx

## 2023-09-28 NOTE — Op Note (Unsigned)
NAME: Brandi Nichols, PLACIDO MEDICAL RECORD NO: [id number] ACCOUNT NO: [id number] DATE OF BIRTH: Erla 22, 1977 FACILITY: [facility] LOCATION: [facility] PHYSICIAN: Suzy Bouchard, MD  Operative Report   DATE OF PROCEDURE: 09/28/2023  PREOPERATIVE DIAGNOSES:  Menorrhagia, unresponsive to conservative therapy.  POSTOPERATIVE DIAGNOSES: 1.  Menorrhagia, unresponsive to conservative therapy. 2.  Right hydrosalpinx.  PROCEDURE: 1.  Total vaginal hysterectomy. 2.  Partial bilateral salpingectomy. 3.  Excision of right hydrosalpinx.  ANESTHESIA:  General endotracheal anesthesia.  SURGEON:  Suzy Bouchard, MD  FIRST ASSISTANT:  Kathalene Frames, MD  INDICATIONS:  A 48 year old gravida 6 para 4 patient with a long history of menorrhagia, bleeding greater than 15 days per month, and unresponsive to conservative therapy.  DESCRIPTION OF PROCEDURE:  After adequate general endotracheal anesthesia, the patient was placed in the dorsal supine position with the legs in the Candycane stirrups.  The patient's abdomen, perineum, and vagina were prepped and draped in normal  sterile fashion.  Timeout was performed.  Straight catheterization of the bladder yielded 100 mL clear urine.  The patient did receive prophylactic Ancef prior to commencement of the case.  Weighted speculum was placed in the posterior vaginal vault and  the cervix was grasped with two thyroid tenacula.  The cervix was circumferentially injected with 1% lidocaine with 1:100,000 epinephrine.  A direct posterior colpotomy incision was made.  Upon entering the posterior cul-de-sac, the peritoneal edge was  tagged to the vaginal epithelium.  A long-billed weighted speculum was placed.  The uterosacral ligaments were then bilaterally clamped, transected, and suture ligated with 0 Vicryl suture.  The anterior cervix was circumferentially incised with the  Bovie.  Cardinal ligaments were then bilaterally clamped, transected, and  suture ligated with 0 Vicryl suture.  The cervix was extremely large and the uterus was bulky.  The uterine arteries were then bilaterally clamped, transected, and suture ligated  with 0 Vicryl suture.  The uterus was then cored out centrally to aid in decompression of the girth of the uterus.  The anterior cul-de-sac was entered sharply without difficulty and a Deaver retractor was placed within to elevate the bladder anteriorly.   Sequential clamping and cutting ensued along the broad ligaments bilaterally.  Ultimately, the cornua were bilaterally clamped, transected, and suture ligated with 0 Vicryl suture.  Good hemostasis was noted.  Attention was directed to the patient's  right fallopian tube, which was the distal portion of the fallopian tube was removed and ligated with 0 Vicryl suture.  It was noted that there was a thicker edematous portion of the fallopian tube consistent with the hydrosalpinx.  This ultimately was  clamped and dissected, removed, and the pedicle was ligated with 0 Vicryl suture.  Similar procedure was repeated on the patient's left distal portion of the fallopian tube and pedicle was secured with 0 Vicryl suture.  Ovaries appeared normal.  The  peritoneum was closed with a 2-0 PDS in a pursestring fashion.  The vaginal cuff was closed with running 0 Vicryl suture and the uterosacral ligaments were plicated centrally and the rest of the cuff was closed with 0 Vicryl suture.  One additional  figure-of-eight suture was required for hemostasis.  Straight catheterization of the bladder yielded an additional 50 mL clear urine.  The patient did receive 30 mg intravenous Toradol at the end of the procedure.  There were no complications.  ESTIMATED BLOOD LOSS:  125 mL.  URINE OUTPUT:  150 mL  INTRAVENOUS FLUID:  700 mL.  The patient was taken  to the recovery room in good condition.   PUS D: 09/28/2023 2:30:07 pm T: 09/28/2023 5:57:00 pm  JOB: [id number]/ [id number]

## 2023-09-28 NOTE — Transfer of Care (Signed)
Immediate Anesthesia Transfer of Care Note  Patient: Brandi Nichols  Procedure(s) Performed: HYSTERECTOMY VAGINAL, BILATERAL SALPINGECTOMY (Bilateral: Vagina )  Patient Location: PACU  Anesthesia Type:General  Level of Consciousness: drowsy and patient cooperative  Airway & Oxygen Therapy: Patient Spontanous Breathing and Patient connected to face mask oxygen  Post-op Assessment: Report given to RN, Post -op Vital signs reviewed and stable, and Patient moving all extremities X 4  Post vital signs: Reviewed and stable  Last Vitals:  Vitals Value Taken Time  BP 149/84 09/28/23 1339  Temp    Pulse 79 09/28/23 1341  Resp 18 09/28/23 1341  SpO2 100 % 09/28/23 1341  Vitals shown include unfiled device data.  Last Pain:  Vitals:   09/28/23 0958  TempSrc: Temporal  PainSc: 0-No pain         Complications: No notable events documented.

## 2023-09-28 NOTE — Anesthesia Procedure Notes (Signed)
Procedure Name: Intubation Date/Time: 09/28/2023 11:22 AM  Performed by: Lanell Matar, CRNAPre-anesthesia Checklist: Patient identified, Emergency Drugs available, Suction available and Patient being monitored Patient Re-evaluated:Patient Re-evaluated prior to induction Oxygen Delivery Method: Circle System Utilized Preoxygenation: Pre-oxygenation with 100% oxygen Induction Type: IV induction Ventilation: Mask ventilation without difficulty Laryngoscope Size: McGrath and 4 Grade View: Grade I Tube type: Oral Tube size: 7.0 mm Number of attempts: 1 Airway Equipment and Method: Stylet and Oral airway Placement Confirmation: ETT inserted through vocal cords under direct vision, positive ETCO2 and breath sounds checked- equal and bilateral Secured at: 21 cm Tube secured with: Tape Dental Injury: Teeth and Oropharynx as per pre-operative assessment

## 2023-09-29 ENCOUNTER — Encounter: Payer: Self-pay | Admitting: Obstetrics and Gynecology

## 2023-09-30 NOTE — Anesthesia Postprocedure Evaluation (Signed)
 Anesthesia Post Note  Patient: Brandi Nichols  Procedure(s) Performed: HYSTERECTOMY VAGINAL, BILATERAL SALPINGECTOMY (Bilateral: Vagina )  Patient location during evaluation: PACU Anesthesia Type: General Level of consciousness: awake and alert Pain management: pain level controlled Vital Signs Assessment: post-procedure vital signs reviewed and stable Respiratory status: spontaneous breathing, nonlabored ventilation, respiratory function stable and patient connected to nasal cannula oxygen Cardiovascular status: blood pressure returned to baseline and stable Postop Assessment: no apparent nausea or vomiting Anesthetic complications: no   No notable events documented.   Last Vitals:  Vitals:   09/28/23 1445 09/28/23 1518  BP: (!) 149/74 (!) 147/70  Pulse: 63 72  Resp: 13 15  Temp: (!) 36.1 C (!) 36.2 C  SpO2: 99% 100%    Last Pain:  Vitals:   09/28/23 1518  TempSrc: Temporal  PainSc: 6                  Cleda Mccreedy Brenton Joines

## 2023-10-02 LAB — SURGICAL PATHOLOGY

## 2024-03-19 ENCOUNTER — Emergency Department

## 2024-03-19 ENCOUNTER — Other Ambulatory Visit: Payer: Self-pay

## 2024-03-19 ENCOUNTER — Emergency Department
Admission: EM | Admit: 2024-03-19 | Discharge: 2024-03-19 | Disposition: A | Discharge status: Home / Self Care | Attending: Emergency Medicine | Admitting: Emergency Medicine

## 2024-03-19 DIAGNOSIS — M546 Pain in thoracic spine: Secondary | ICD-10-CM | POA: Diagnosis not present

## 2024-03-19 DIAGNOSIS — I10 Essential (primary) hypertension: Secondary | ICD-10-CM | POA: Insufficient documentation

## 2024-03-19 DIAGNOSIS — S39011A Strain of muscle, fascia and tendon of abdomen, initial encounter: Secondary | ICD-10-CM | POA: Insufficient documentation

## 2024-03-19 DIAGNOSIS — X58XXXA Exposure to other specified factors, initial encounter: Secondary | ICD-10-CM | POA: Diagnosis not present

## 2024-03-19 DIAGNOSIS — R109 Unspecified abdominal pain: Secondary | ICD-10-CM | POA: Diagnosis present

## 2024-03-19 DIAGNOSIS — T148XXA Other injury of unspecified body region, initial encounter: Secondary | ICD-10-CM

## 2024-03-19 LAB — URINALYSIS, ROUTINE W REFLEX MICROSCOPIC
Bilirubin Urine: NEGATIVE
Glucose, UA: NEGATIVE mg/dL
Hgb urine dipstick: NEGATIVE
Ketones, ur: NEGATIVE mg/dL
Leukocytes,Ua: NEGATIVE
Nitrite: NEGATIVE
Protein, ur: NEGATIVE mg/dL
Specific Gravity, Urine: 1.014 (ref 1.005–1.030)
pH: 5 (ref 5.0–8.0)

## 2024-03-19 LAB — COMPREHENSIVE METABOLIC PANEL WITH GFR
ALT: 28 U/L (ref 0–44)
AST: 26 U/L (ref 15–41)
Albumin: 4.1 g/dL (ref 3.5–5.0)
Alkaline Phosphatase: 63 U/L (ref 38–126)
Anion gap: 12 (ref 5–15)
BUN: 13 mg/dL (ref 6–20)
CO2: 24 mmol/L (ref 22–32)
Calcium: 9.7 mg/dL (ref 8.9–10.3)
Chloride: 102 mmol/L (ref 98–111)
Creatinine, Ser: 1.19 mg/dL — ABNORMAL HIGH (ref 0.44–1.00)
GFR, Estimated: 57 mL/min — ABNORMAL LOW (ref >60)
Glucose, Bld: 113 mg/dL — ABNORMAL HIGH (ref 70–99)
Potassium: 3.7 mmol/L (ref 3.5–5.1)
Sodium: 138 mmol/L (ref 135–145)
Total Bilirubin: 0.7 mg/dL (ref 0.0–1.2)
Total Protein: 7.5 g/dL (ref 6.5–8.1)

## 2024-03-19 LAB — CBC
HCT: 45.3 % (ref 36.0–46.0)
Hemoglobin: 15.3 g/dL — ABNORMAL HIGH (ref 12.0–15.0)
MCH: 30.4 pg (ref 26.0–34.0)
MCHC: 33.8 g/dL (ref 30.0–36.0)
MCV: 90.1 fL (ref 80.0–100.0)
Platelets: 251 K/uL (ref 150–400)
RBC: 5.03 MIL/uL (ref 3.87–5.11)
RDW: 13 % (ref 11.5–15.5)
WBC: 4.2 K/uL (ref 4.0–10.5)
nRBC: 0 % (ref 0.0–0.2)

## 2024-03-19 LAB — LIPASE, BLOOD: Lipase: 33 U/L (ref 11–51)

## 2024-03-19 MED ORDER — OXYCODONE-ACETAMINOPHEN 5-325 MG PO TABS
1.0000 | ORAL_TABLET | Freq: Once | ORAL | Status: AC
  Administered 2024-03-19 (×2): 1 via ORAL
  Filled 2024-03-19: qty 1

## 2024-03-19 MED ORDER — LISINOPRIL-HYDROCHLOROTHIAZIDE 10-12.5 MG PO TABS: 1.0000 | ORAL_TABLET | Freq: Every day | ORAL | 1 refills | Status: AC

## 2024-03-19 MED ORDER — LACTATED RINGERS IV BOLUS
1000.0000 mL | Freq: Once | INTRAVENOUS | Status: AC
  Administered 2024-03-19 (×2): 1000 mL via INTRAVENOUS

## 2024-03-19 MED ORDER — CYCLOBENZAPRINE HCL 5 MG PO TABS: 5.0000 mg | ORAL_TABLET | Freq: Three times a day (TID) | ORAL | 0 refills | Status: AC | PRN

## 2024-03-19 MED ORDER — LIDOCAINE 5 % EX PTCH: 1.0000 | MEDICATED_PATCH | Freq: Two times a day (BID) | CUTANEOUS | 0 refills | Status: AC

## 2024-03-19 MED ORDER — ONDANSETRON HCL 4 MG/2ML IJ SOLN
4.0000 mg | Freq: Once | INTRAMUSCULAR | Status: AC
  Administered 2024-03-19 (×2): 4 mg via INTRAVENOUS
  Filled 2024-03-19: qty 2

## 2024-03-19 MED ORDER — LIDOCAINE 5 % EX PTCH
1.0000 | MEDICATED_PATCH | CUTANEOUS | Status: DC
  Administered 2024-03-19 (×2): 1 via TRANSDERMAL
  Filled 2024-03-19: qty 1

## 2024-03-19 MED ORDER — MORPHINE SULFATE (PF) 4 MG/ML IV SOLN
4.0000 mg | Freq: Once | INTRAVENOUS | Status: AC
  Administered 2024-03-19 (×2): 4 mg via INTRAVENOUS
  Filled 2024-03-19: qty 1

## 2024-03-19 MED ORDER — IOHEXOL 350 MG/ML SOLN
100.0000 mL | Freq: Once | INTRAVENOUS | Status: AC | PRN
  Administered 2024-03-19 (×2): 100 mL via INTRAVENOUS

## 2024-03-19 NOTE — ED Triage Notes (Addendum)
 Pt c/o right upper quadrant pain that started yesterday and is painful with respirations. Pt reports hx of hypertension. Pt denies any n/v or diarrhea. Pt has been out of her BP medication for a month now

## 2024-03-19 NOTE — ED Provider Notes (Signed)
 [facility] Provider Note    Event Date/Time   First MD Initiated Contact with Patient 03/19/24 1249     (approximate)   History   Chief Complaint Abdominal Pain   HPI  Brandi Nichols is a 48 y.o. female with past medical history of hypertension, GERD, and iron  deficiency anemia who presents to the ED complaining of abdominal pain.  Patient reports that she has had about 24 hours of increasing pain in her right flank and upper back.  She describes it as a sharp pain that is worse when she takes a deep breath, but she denies any fevers, cough, or difficulty breathing.  Pain will wrap around to the right side into her upper abdomen, but she states she has previously had her gallbladder removed.  She denies any associated nausea or vomiting and has not had any changes in her bowel movements.  She denies any pain in her chest.  Patient does admit that she has been off of her blood pressure medication for the past month.      Physical Exam   Triage Vital Signs: ED Triage Vitals [03/19/24 1138]  Encounter Vitals Group     BP (!) 211/94     Girls Systolic BP Percentile      Girls Diastolic BP Percentile      Boys Systolic BP Percentile      Boys Diastolic BP Percentile      Pulse Rate 77     Resp 16     Temp 98.9 F (37.2 C)     Temp Source Oral     SpO2 95 %     Weight 187 lb (84.8 kg)     Height 5' 6 (1.676 m)     Head Circumference      Peak Flow      Pain Score 7     Pain Loc      Pain Education      Exclude from Growth Chart     Most recent vital signs: Vitals:   03/19/24 1416 03/19/24 1601  BP: (!) 187/90 (!) 178/88  Pulse: 70 72  Resp: 16 16  Temp:  98 F (36.7 C)  SpO2: 96% 96%    Constitutional: Alert and oriented. Eyes: Conjunctivae are normal. Head: Atraumatic. Nose: No congestion/rhinnorhea. Mouth/Throat: Mucous membranes are moist.  Cardiovascular: Normal rate, regular rhythm. Grossly normal heart sounds.  2+ radial  pulses bilaterally. Respiratory: Normal respiratory effort.  No retractions. Lungs CTAB. Gastrointestinal: Soft and nontender.  Right CVA tenderness to palpation.  No distention. Musculoskeletal: No lower extremity tenderness nor edema.  Neurologic:  Normal speech and language. No gross focal neurologic deficits are appreciated.    ED Results / Procedures / Treatments   Labs (all labs ordered are listed, but only abnormal results are displayed) Labs Reviewed  COMPREHENSIVE METABOLIC PANEL WITH GFR - Abnormal; Notable for the following components:      Result Value   Glucose, Bld 113 (*)    Creatinine, Ser 1.19 (*)    GFR, Estimated 57 (*)    All other components within normal limits  CBC - Abnormal; Notable for the following components:   Hemoglobin 15.3 (*)    All other components within normal limits  URINALYSIS, ROUTINE W REFLEX MICROSCOPIC - Abnormal; Notable for the following components:   Color, Urine STRAW (*)    APPearance CLOUDY (*)    All other components within normal limits  LIPASE, BLOOD  EKG  ED ECG REPORT I, Carlin Palin, the attending physician, personally viewed and interpreted this ECG.   Date: 03/19/2024  EKG Time: 14:27  Rate: 67  Rhythm: normal sinus rhythm  Axis: Normal  Intervals:none  ST&T Change: None  RADIOLOGY CTA chest reviewed and interpreted by me with no pulmonary embolism or focal infiltrate noted.  PROCEDURES:  Critical Care performed: No  Procedures   MEDICATIONS ORDERED IN ED: Medications  lidocaine  (LIDODERM ) 5 % 1 patch (has no administration in time range)  oxyCODONE -acetaminophen  (PERCOCET/ROXICET) 5-325 MG per tablet 1 tablet (has no administration in time range)  morphine  (PF) 4 MG/ML injection 4 mg (4 mg Intravenous Given 03/19/24 1327)  ondansetron  (ZOFRAN ) injection 4 mg (4 mg Intravenous Given 03/19/24 1327)  lactated ringers  bolus 1,000 mL (1,000 mLs Intravenous New Bag/Given 03/19/24 1326)  iohexol   (OMNIPAQUE ) 350 MG/ML injection 100 mL (100 mLs Intravenous Contrast Given 03/19/24 1436)     IMPRESSION / MDM / ASSESSMENT AND PLAN / ED COURSE  I reviewed the triage vital signs and the nursing notes.                              48 y.o. female with past medical history of hypertension, GERD, and iron  deficiency who presents to the ED complaining of right flank and upper back pain over the past 24 hours.  Patient's presentation is most consistent with acute presentation with potential threat to life or bodily function.  Differential diagnosis includes, but is not limited to, PE, pneumonia, pancreatitis, hepatitis, retained gallstones, UTI, kidney stone.  Patient nontoxic-appearing and in no acute distress, vital signs remarkable for hypertension but otherwise reassuring.  Pain reproducible with palpation of her right costovertebral area as well as her right upper back.  With pleuritic symptoms, will further assess with CTA of her chest to rule out PE, also image through her abdomen/pelvis for possible kidney stone.  Labs with mild AKI but no significant anemia, leukocytosis, or electrolyte abnormality.  LFTs and lipase are unremarkable, urinalysis shows no signs of infection.  We will treat symptomatically with IV morphine  and Zofran , hydrate with IV fluids and reassess.  CTA chest is negative for acute process, CT of the abdomen/pelvis is also unremarkable.  With reassuring workup, suspect musculoskeletal etiology for her pain and patient is appropriate for discharge home with outpatient follow-up.  BP improving on recheck and no evidence of hypertensive emergency, will refill her antihypertensive medication.  She was counseled to return to the ED for new or worsening symptoms, patient agrees with plan.      FINAL CLINICAL IMPRESSION(S) / ED DIAGNOSES   Final diagnoses:  Flank pain  Muscle strain  Uncontrolled hypertension     Rx / DC Orders   ED Discharge Orders          Ordered     lidocaine  (LIDODERM ) 5 %  Every 12 hours        03/19/24 1627    cyclobenzaprine  (FLEXERIL ) 5 MG tablet  3 times daily PRN        03/19/24 1627    lisinopril -hydrochlorothiazide  (ZESTORETIC ) 10-12.5 MG tablet  Daily        03/19/24 1628             Note:  This document was prepared using Dragon voice recognition software and may include unintentional dictation errors.   Palin Carlin, MD 03/19/24 1630

## 2024-08-20 ENCOUNTER — Other Ambulatory Visit: Payer: Self-pay | Admitting: Family Medicine

## 2024-08-20 DIAGNOSIS — Z1231 Encounter for screening mammogram for malignant neoplasm of breast: Secondary | ICD-10-CM

## 2024-09-19 ENCOUNTER — Encounter
